# Patient Record
Sex: Male | Born: 1980 | Hispanic: No | Marital: Married | Smoking: Former smoker
Health system: Southern US, Community
[De-identification: ages and names within clinical notes are randomized; demographics above are authoritative.]

## PROBLEM LIST (undated history)

## (undated) DIAGNOSIS — F419 Anxiety disorder, unspecified: Secondary | ICD-10-CM

## (undated) DIAGNOSIS — I10 Essential (primary) hypertension: Secondary | ICD-10-CM

## (undated) DIAGNOSIS — F909 Attention-deficit hyperactivity disorder, unspecified type: Secondary | ICD-10-CM

---

## 2007-03-15 ENCOUNTER — Ambulatory Visit: Payer: Self-pay | Admitting: Psychology

## 2007-04-15 ENCOUNTER — Ambulatory Visit: Payer: Self-pay | Admitting: Psychology

## 2007-04-27 ENCOUNTER — Ambulatory Visit: Payer: Self-pay | Admitting: Psychology

## 2007-05-13 ENCOUNTER — Ambulatory Visit: Payer: Self-pay | Admitting: Psychology

## 2007-05-27 ENCOUNTER — Ambulatory Visit: Payer: Self-pay | Admitting: Psychology

## 2007-06-10 ENCOUNTER — Ambulatory Visit: Payer: Self-pay | Admitting: Psychology

## 2007-07-08 ENCOUNTER — Ambulatory Visit: Payer: Self-pay | Admitting: Psychology

## 2007-08-19 ENCOUNTER — Ambulatory Visit: Payer: Self-pay | Admitting: Psychology

## 2009-05-23 ENCOUNTER — Ambulatory Visit: Payer: Self-pay | Admitting: Sports Medicine

## 2009-05-23 DIAGNOSIS — M216X9 Other acquired deformities of unspecified foot: Secondary | ICD-10-CM | POA: Insufficient documentation

## 2009-11-26 ENCOUNTER — Ambulatory Visit: Payer: Self-pay | Admitting: Family Medicine

## 2009-11-26 DIAGNOSIS — M25569 Pain in unspecified knee: Secondary | ICD-10-CM | POA: Insufficient documentation

## 2010-03-12 NOTE — Assessment & Plan Note (Signed)
Summary: 3:30 APPT,RT KNEE PAIN,MC   Vital Signs:  Patient profile:   30 year old male Height:      71.75 inches Weight:      190 pounds Pulse rate:   54 / minute BP sitting:   128 / 84  (left arm)  Vitals Entered By: Rochele Pages RN (November 26, 2009 3:31 PM) CC: right knee pain- medial    CC:  right knee pain- medial .  History of Present Illness: Right knee pain for a week. Training for his first marathon and had a long run last week of 10 miles. Nopain during or immediately after run but now focal area of soreness that hurts when he runs down hills, Pain is 2/10 no prior knee injury or surgery.  Current Medications (verified): 1)  None  Allergies: No Known Drug Allergies  Review of Systems  The patient denies fever, weight loss, and weight gain.         no locking or giving way of knee.  Physical Exam  General:  alert, well-developed, well-nourished, and well-hydrated.   Additional Exam:  [place] righ knee: no effuiosn. menisci look normal . VMO is tracked to it's insertion and there is no defect, no edema or calcifications noted   Knee Exam  Knee Exam:    Right:    Inspection:  Normal    Palpation:  Normal    Stability:  stable    Tenderness:  mildly TTP insertion VMO    Swelling:  no    Erythema:  no    no effusion. Ligamentously intact. neg McMurray. Normal Lachman. Right patella has reduced mobility compared with left but no apprehension, no crepitus, no ballottment    Left:    Inspection:  Normal    Palpation:  Normal    Stability:  stable    Tenderness:  no    Swelling:  no    Erythema:  no    GAIT: Slight genu valgus B.  Normal stride length, neutral heel / mid foot strike. Stiff upper body.   Impression & Recommendations:  Problem # 1:  KNEE PAIN, RIGHT, ACUTE (ICD-719.46)  Mild strain with increased distance. general knee strengthening program given for HEP. Icing. RTC PRN  Orders: [place] LIMITED ([id number])   Orders Added: 1)  New Patient Level II  [99202] 2)  [place] LIMITED [[id number]]

## 2010-03-12 NOTE — Assessment & Plan Note (Signed)
Summary: NP,FLAT FEET,RUNNER,MC   Vital Signs:  Patient profile:   30 year old male Height:      72 inches Weight:      190 pounds BMI:     25.86 BP sitting:   140 / 88  Vitals Entered By: Lillia Pauls CMA (May 23, 2009 9:29 AM)  History of Present Illness: Several year history of intermittent foot, lateral knee, and diffuse lumbar pain. 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 No recent tennis activity. Recently began low mileage recreational running (3-4 miles weekly). Noted some non-radiating pain of the lateral knees relieved by rest. No swelling/buckling/locking/catching/popping. No prior back/hip/knee/foot/ankle injuries or procedures. No pain currently, but concerned that flat feet could be the root of random intermittent pains. Now using the elliptical without pain. Asymptomatic today.  Allergies (verified): No Known Drug Allergies PMH-FH-SH reviewed for relevance  Physical Exam  General:  Well-developed,well-nourished,in no acute distress; alert,appropriate and cooperative throughout examination Msk:  HIPS/PELVIS: FROM w/o pain. No asymmetry. No pain on pelvic rocking maneuver. No back/hip/leg pain on FABER.  KNEES: Full ROM/strength. No ttp/swelling/discoloration. Nl ligamentous stability.  ANKLES/FEET: Full ROM/strength. No ttp. Pes planus with excessive pronation and navicular drop on standing. No callouses.  RUNNING FORM: Bilateral  foot pronation (R>L); significantly controlled on comforthotic insertion. Extremities:  Equal leg lengths. Neurologic:  Normal nv exam of lower extremities.   Impression & Recommendations:  Problem # 1:  OTHER ACQUIRED DEFORMITY OF ANKLE AND FOOT OTHER (ICD-736.79)  - Comforthotics. - Exercises per patient instructions. - RTC in 6 wks prn.  Orders: Sports Insoles [phone])  Patient Instructions: 1)  Pidgeon-toe heel raises - 30 times. 2)  Pidgeon-toe walks - 10 times. 3)  Straight leg  side swings - 120 times. 4)  Hip Rotation (Up-Out-In-Down) - 120 times.

## 2013-06-13 ENCOUNTER — Encounter (INDEPENDENT_AMBULATORY_CARE_PROVIDER_SITE_OTHER): Payer: Self-pay | Admitting: General Surgery

## 2013-06-13 ENCOUNTER — Ambulatory Visit (INDEPENDENT_AMBULATORY_CARE_PROVIDER_SITE_OTHER): Payer: BC Managed Care – PPO | Admitting: General Surgery

## 2013-06-13 VITALS — BP 128/80 | HR 72 | Temp 97.1°F | Resp 14 | Ht 72.0 in | Wt 220.0 lb

## 2013-06-13 DIAGNOSIS — K625 Hemorrhage of anus and rectum: Secondary | ICD-10-CM

## 2013-06-13 NOTE — Progress Notes (Signed)
Chief Complaint  Patient presents with  . New Evaluation    eval internal hems    HISTORY: Marney DoctorZiad Harvill is a 33 y.o. male who presents to the office with rectal bleeding.  Other symptoms include occasional prolapse.  This had been occurring for several years.  He has tried banding, hemorrhoid creams in the past with some success.  Vigorous activity makes the symptoms worse.   It is intermittent in nature.  His bowel habits are regular and his bowel movements are usually soft.  His fiber intake is dietary. He had tried adding metamucil in the past but denies any change in bleeding.  His last colonoscopy was in 2013.  He has had prolapsing tissue occasionally.     History reviewed. No pertinent past medical history.    History reviewed. No pertinent past surgical history.      Current Outpatient Prescriptions  Medication Sig Dispense Refill  . amphetamine-dextroamphetamine (ADDERALL) 20 MG tablet Take 20 mg by mouth at bedtime as needed.      . clonazePAM (KLONOPIN) 1 MG tablet Take 1 mg by mouth 2 (two) times daily as needed for anxiety.       No current facility-administered medications for this visit.      No Known Allergies    Family History  Problem Relation Age of Onset  . Cancer Paternal Uncle [id number]    lung cancer    History   Social History  . Marital Status: Married    Spouse Name: N/A    Number of Children: N/A  . Years of Education: N/A   Social History Main Topics  . Smoking status: Former Smoker    Quit date: 06/13/2008  . Smokeless tobacco: None  . Alcohol Use: Yes     Comment: occ  . Drug Use: No  . Sexual Activity: None   Other Topics Concern  . None   Social History Narrative  . None      REVIEW OF SYSTEMS - PERTINENT POSITIVES ONLY: Review of Systems - General ROS: negative for - chills, fever or weight loss Hematological and Lymphatic ROS: negative for - bleeding problems, blood clots or bruising Respiratory ROS: no cough, shortness of breath, or  wheezing Cardiovascular ROS: no chest pain or dyspnea on exertion Gastrointestinal ROS: no abdominal pain, change in bowel habits, or black or bloody stools Genito-Urinary ROS: no dysuria, trouble voiding, or hematuria  EXAM: Filed Vitals:   06/13/13 0914  BP: 128/80  Pulse: 72  Temp: 97.1 F (36.2 C)  Resp: 14    General appearance: alert and cooperative Resp: clear to auscultation bilaterally Cardio: regular rate and rhythm GI: normal findings: soft, non-tender   Procedure: Anoscopy Surgeon: Maisie Fushomas Diagnosis: rectal bleeding  Assistant: Hendricks After the risks and benefits were explained, verbal consent was obtained for above procedure  Anesthesia: none Findings: grade 2, inflamed internal hemorrhoids in all 3 positions     ASSESSMENT AND PLAN: Marney DoctorZiad Shinault Is a 33 year old male who presents to the office with rectal bleeding that is worse with vigorous activity. He has tried her band ligation, hemorrhoid creams and fiber supplements with no change in his symptoms. On exam he has moderately inflamed grade 2 internal hemorrhoids in all 3 positions. I've recommended that he undergo THD. I think this is a good solution to his problems. We discussed this in detail. The main risks include bleeding, pain and hemorrhoid recurrence. I believe he understands this and has agreed to proceed with surgery.  Vanita PandaAlicia C Mina Babula, MD Colon and Rectal Surgery / General Surgery [facility]      Visit Diagnoses: 1. Rectal bleeding     Primary Care Physician: Thora LanceEHINGER,ROBERT R, MD

## 2013-06-13 NOTE — Patient Instructions (Signed)

## 2013-10-03 ENCOUNTER — Other Ambulatory Visit (INDEPENDENT_AMBULATORY_CARE_PROVIDER_SITE_OTHER): Payer: Self-pay

## 2013-10-03 DIAGNOSIS — K648 Other hemorrhoids: Secondary | ICD-10-CM

## 2013-10-03 MED ORDER — OXYCODONE-ACETAMINOPHEN 5-325 MG PO TABS: 1.0000 | ORAL_TABLET | ORAL | Status: DC | PRN

## 2013-10-04 ENCOUNTER — Telehealth (INDEPENDENT_AMBULATORY_CARE_PROVIDER_SITE_OTHER): Payer: Self-pay

## 2013-10-04 ENCOUNTER — Other Ambulatory Visit (INDEPENDENT_AMBULATORY_CARE_PROVIDER_SITE_OTHER): Payer: Self-pay

## 2013-10-04 NOTE — Telephone Encounter (Signed)
Pt s/p THD on 10/03/13. Pt states that he has had a lot of pressure and urgency to have a BM. Pt also states that on his discharge summary he was advised not to take his Miralax for 48 hours. Informed pt that the urgency and pressure is normal after this procedure and this will take some time to go away. Informed pt that I will ask Dr Maisie Fus regarding the Miralax. Pt would also like to know what else he can do for the pressure and urgency. Informed pt that we would contact him as soon as we received a response from Dr Maisie Fus. Pt verbalized understanding.

## 2013-10-04 NOTE — Telephone Encounter (Signed)
Warm sitz baths for urgency.  Can take his Miralax if he feels he needs it.

## 2013-10-04 NOTE — Telephone Encounter (Signed)
Pt aware of Dr Maurine Minister recommendations. Pt verbalized understanding

## 2013-10-06 ENCOUNTER — Telehealth (INDEPENDENT_AMBULATORY_CARE_PROVIDER_SITE_OTHER): Payer: Self-pay | Admitting: General Surgery

## 2013-10-06 NOTE — Telephone Encounter (Signed)
Pt calling back this am. Encouraged pt to drink plenty of fluids, move around as much as possible and continue to take the MOM every 8 hours. Advised pt that if he did not have a BM then he can also do a enema every 8 hours as well. Pt verbalized understanding

## 2013-10-06 NOTE — Telephone Encounter (Signed)
Patient called with constipation and pain.  Taking Miralax, colace and fiber.  Recommended plenty of liquids, milk of magnesia every 8 hours until BM.  If no BM in 24 h, give gentle enema every 8 hrs until BM.  Increase sitz baths for pain relief.

## 2013-10-07 ENCOUNTER — Other Ambulatory Visit (INDEPENDENT_AMBULATORY_CARE_PROVIDER_SITE_OTHER): Payer: Self-pay

## 2013-10-07 ENCOUNTER — Telehealth (INDEPENDENT_AMBULATORY_CARE_PROVIDER_SITE_OTHER): Payer: Self-pay

## 2013-10-07 ENCOUNTER — Other Ambulatory Visit (INDEPENDENT_AMBULATORY_CARE_PROVIDER_SITE_OTHER): Payer: Self-pay | Admitting: General Surgery

## 2013-10-07 ENCOUNTER — Telehealth (INDEPENDENT_AMBULATORY_CARE_PROVIDER_SITE_OTHER): Payer: Self-pay | Admitting: *Deleted

## 2013-10-07 DIAGNOSIS — G8918 Other acute postprocedural pain: Secondary | ICD-10-CM

## 2013-10-07 MED ORDER — OXYCODONE-ACETAMINOPHEN 5-325 MG PO TABS: 1.0000 | ORAL_TABLET | ORAL | Status: DC | PRN

## 2013-10-07 MED ORDER — TRAMADOL HCL 50 MG PO TABS: 50.0000 mg | ORAL_TABLET | Freq: Four times a day (QID) | ORAL | Status: DC | PRN

## 2013-10-07 MED ORDER — LIDOCAINE 5 % EX OINT: 1.0000 "application " | TOPICAL_OINTMENT | CUTANEOUS | Status: DC | PRN

## 2013-10-07 NOTE — Telephone Encounter (Signed)
Pt called stating he is almost out of pain med. Pt requesting refill of pain med. Pt doing well but has had constipation. No fever. No nausea. Tramadol 50 mg # 30 called to [facility] per protocol. Pt advised if this does not control discomfort. Pt states he understands.

## 2013-10-07 NOTE — Telephone Encounter (Signed)
Percocet refilled.  Rx sent for lidocaine ointment as well.  Pt notified.

## 2013-10-07 NOTE — Telephone Encounter (Signed)
Pt called this afternoon in pain.  He called earlier and Arline Asp gave him the protocol refill of Tramadol, but pt states he took his 1st dose at 11:00 and it is not helping like the Oxycodone.  Pt states that he is doing sitz baths and just taking in liquids as you recommended earlier in the week when he called.  He wants to know 1) is there anything topical you can prescribe to help numb the area 2) how long should he keep his diet liquids only?  He states that the constipation is better, but now its the pain.  He said that on the Oxycodone, that he was at least able to sit up, but with the Tramadol, he has to lay down.  I advised pt I would check with Dr. Maisie Fus and see what her recommendations would be and I would return his call.  The pt verbalized understanding.  Please advise!  Victorino Dike

## 2013-10-10 ENCOUNTER — Telehealth (INDEPENDENT_AMBULATORY_CARE_PROVIDER_SITE_OTHER): Payer: Self-pay

## 2013-10-10 NOTE — Telephone Encounter (Signed)
Pt called stating he is now having BMs but wants to return to work. Pt going to try to return to work. Pt advised not to drive with narcotic pain med. Pt states he understands. Diet reviewed with pt. Pt to call with concerns.

## 2013-10-18 ENCOUNTER — Encounter (INDEPENDENT_AMBULATORY_CARE_PROVIDER_SITE_OTHER): Payer: BC Managed Care – PPO | Admitting: General Surgery

## 2014-01-09 ENCOUNTER — Ambulatory Visit (INDEPENDENT_AMBULATORY_CARE_PROVIDER_SITE_OTHER): Payer: BC Managed Care – PPO | Admitting: Psychology

## 2014-01-09 DIAGNOSIS — F341 Dysthymic disorder: Secondary | ICD-10-CM

## 2014-01-20 ENCOUNTER — Ambulatory Visit (INDEPENDENT_AMBULATORY_CARE_PROVIDER_SITE_OTHER): Payer: BC Managed Care – PPO | Admitting: Psychology

## 2014-01-20 DIAGNOSIS — F341 Dysthymic disorder: Secondary | ICD-10-CM

## 2014-01-24 ENCOUNTER — Ambulatory Visit (INDEPENDENT_AMBULATORY_CARE_PROVIDER_SITE_OTHER): Payer: BC Managed Care – PPO | Admitting: Psychology

## 2014-01-24 DIAGNOSIS — F341 Dysthymic disorder: Secondary | ICD-10-CM

## 2014-01-30 ENCOUNTER — Ambulatory Visit: Payer: BC Managed Care – PPO | Admitting: Psychology

## 2014-02-09 ENCOUNTER — Ambulatory Visit: Payer: BC Managed Care – PPO | Admitting: Psychology

## 2014-02-21 ENCOUNTER — Ambulatory Visit (INDEPENDENT_AMBULATORY_CARE_PROVIDER_SITE_OTHER): Payer: BLUE CROSS/BLUE SHIELD | Admitting: Psychology

## 2014-02-21 DIAGNOSIS — F341 Dysthymic disorder: Secondary | ICD-10-CM

## 2014-03-01 ENCOUNTER — Ambulatory Visit (INDEPENDENT_AMBULATORY_CARE_PROVIDER_SITE_OTHER): Payer: BLUE CROSS/BLUE SHIELD | Admitting: Psychology

## 2014-03-01 DIAGNOSIS — F341 Dysthymic disorder: Secondary | ICD-10-CM

## 2014-03-09 ENCOUNTER — Ambulatory Visit (INDEPENDENT_AMBULATORY_CARE_PROVIDER_SITE_OTHER): Payer: BLUE CROSS/BLUE SHIELD | Admitting: Psychology

## 2014-03-09 DIAGNOSIS — F341 Dysthymic disorder: Secondary | ICD-10-CM

## 2014-03-20 ENCOUNTER — Ambulatory Visit (INDEPENDENT_AMBULATORY_CARE_PROVIDER_SITE_OTHER): Payer: BLUE CROSS/BLUE SHIELD | Admitting: Psychology

## 2014-03-20 DIAGNOSIS — F341 Dysthymic disorder: Secondary | ICD-10-CM

## 2014-03-28 ENCOUNTER — Ambulatory Visit (INDEPENDENT_AMBULATORY_CARE_PROVIDER_SITE_OTHER): Payer: BLUE CROSS/BLUE SHIELD | Admitting: Psychology

## 2014-03-28 DIAGNOSIS — F341 Dysthymic disorder: Secondary | ICD-10-CM

## 2014-04-18 ENCOUNTER — Ambulatory Visit (INDEPENDENT_AMBULATORY_CARE_PROVIDER_SITE_OTHER): Payer: BLUE CROSS/BLUE SHIELD | Admitting: Psychology

## 2014-04-18 DIAGNOSIS — F341 Dysthymic disorder: Secondary | ICD-10-CM | POA: Diagnosis not present

## 2014-06-19 ENCOUNTER — Ambulatory Visit (INDEPENDENT_AMBULATORY_CARE_PROVIDER_SITE_OTHER): Payer: BLUE CROSS/BLUE SHIELD | Admitting: Psychology

## 2014-06-19 DIAGNOSIS — F341 Dysthymic disorder: Secondary | ICD-10-CM | POA: Diagnosis not present

## 2014-06-22 ENCOUNTER — Ambulatory Visit (INDEPENDENT_AMBULATORY_CARE_PROVIDER_SITE_OTHER): Payer: BLUE CROSS/BLUE SHIELD | Admitting: Psychology

## 2014-06-22 DIAGNOSIS — F341 Dysthymic disorder: Secondary | ICD-10-CM

## 2014-06-26 ENCOUNTER — Ambulatory Visit (INDEPENDENT_AMBULATORY_CARE_PROVIDER_SITE_OTHER): Payer: BLUE CROSS/BLUE SHIELD | Admitting: Psychology

## 2014-06-26 DIAGNOSIS — F341 Dysthymic disorder: Secondary | ICD-10-CM

## 2014-07-05 ENCOUNTER — Ambulatory Visit (INDEPENDENT_AMBULATORY_CARE_PROVIDER_SITE_OTHER): Payer: BLUE CROSS/BLUE SHIELD | Admitting: Psychology

## 2014-07-05 DIAGNOSIS — F341 Dysthymic disorder: Secondary | ICD-10-CM | POA: Diagnosis not present

## 2014-07-20 ENCOUNTER — Ambulatory Visit: Payer: BLUE CROSS/BLUE SHIELD | Admitting: Psychology

## 2014-07-27 ENCOUNTER — Ambulatory Visit (INDEPENDENT_AMBULATORY_CARE_PROVIDER_SITE_OTHER): Payer: BLUE CROSS/BLUE SHIELD | Admitting: Psychology

## 2014-07-27 DIAGNOSIS — F341 Dysthymic disorder: Secondary | ICD-10-CM | POA: Diagnosis not present

## 2014-08-09 ENCOUNTER — Ambulatory Visit (INDEPENDENT_AMBULATORY_CARE_PROVIDER_SITE_OTHER): Payer: BLUE CROSS/BLUE SHIELD | Admitting: Psychology

## 2014-08-09 DIAGNOSIS — F341 Dysthymic disorder: Secondary | ICD-10-CM

## 2014-08-17 ENCOUNTER — Ambulatory Visit (INDEPENDENT_AMBULATORY_CARE_PROVIDER_SITE_OTHER): Payer: BLUE CROSS/BLUE SHIELD | Admitting: Psychology

## 2014-08-17 DIAGNOSIS — F341 Dysthymic disorder: Secondary | ICD-10-CM

## 2014-08-31 ENCOUNTER — Ambulatory Visit: Payer: BLUE CROSS/BLUE SHIELD | Admitting: Psychology

## 2014-09-08 ENCOUNTER — Ambulatory Visit (INDEPENDENT_AMBULATORY_CARE_PROVIDER_SITE_OTHER): Payer: BLUE CROSS/BLUE SHIELD | Admitting: Psychology

## 2014-09-08 DIAGNOSIS — F341 Dysthymic disorder: Secondary | ICD-10-CM | POA: Diagnosis not present

## 2014-09-21 ENCOUNTER — Ambulatory Visit (INDEPENDENT_AMBULATORY_CARE_PROVIDER_SITE_OTHER): Payer: BLUE CROSS/BLUE SHIELD | Admitting: Psychology

## 2014-09-21 DIAGNOSIS — F341 Dysthymic disorder: Secondary | ICD-10-CM

## 2014-09-25 ENCOUNTER — Ambulatory Visit (INDEPENDENT_AMBULATORY_CARE_PROVIDER_SITE_OTHER): Payer: BLUE CROSS/BLUE SHIELD | Admitting: Psychology

## 2014-09-25 DIAGNOSIS — F341 Dysthymic disorder: Secondary | ICD-10-CM

## 2014-10-03 ENCOUNTER — Ambulatory Visit (INDEPENDENT_AMBULATORY_CARE_PROVIDER_SITE_OTHER): Payer: BLUE CROSS/BLUE SHIELD | Admitting: Psychology

## 2014-10-03 DIAGNOSIS — F341 Dysthymic disorder: Secondary | ICD-10-CM | POA: Diagnosis not present

## 2014-10-18 ENCOUNTER — Ambulatory Visit (INDEPENDENT_AMBULATORY_CARE_PROVIDER_SITE_OTHER): Payer: BLUE CROSS/BLUE SHIELD | Admitting: Psychology

## 2014-10-18 DIAGNOSIS — F341 Dysthymic disorder: Secondary | ICD-10-CM | POA: Diagnosis not present

## 2014-11-01 ENCOUNTER — Ambulatory Visit (INDEPENDENT_AMBULATORY_CARE_PROVIDER_SITE_OTHER): Payer: BLUE CROSS/BLUE SHIELD | Admitting: Psychology

## 2014-11-01 DIAGNOSIS — F341 Dysthymic disorder: Secondary | ICD-10-CM

## 2014-11-08 ENCOUNTER — Ambulatory Visit (INDEPENDENT_AMBULATORY_CARE_PROVIDER_SITE_OTHER): Payer: BLUE CROSS/BLUE SHIELD | Admitting: Psychology

## 2014-11-08 DIAGNOSIS — F341 Dysthymic disorder: Secondary | ICD-10-CM

## 2014-11-21 ENCOUNTER — Ambulatory Visit (INDEPENDENT_AMBULATORY_CARE_PROVIDER_SITE_OTHER): Payer: BLUE CROSS/BLUE SHIELD | Admitting: Psychology

## 2014-11-21 DIAGNOSIS — F341 Dysthymic disorder: Secondary | ICD-10-CM | POA: Diagnosis not present

## 2014-11-22 ENCOUNTER — Ambulatory Visit: Payer: BLUE CROSS/BLUE SHIELD | Admitting: Psychology

## 2014-12-12 ENCOUNTER — Ambulatory Visit (INDEPENDENT_AMBULATORY_CARE_PROVIDER_SITE_OTHER): Payer: BLUE CROSS/BLUE SHIELD | Admitting: Psychology

## 2014-12-12 DIAGNOSIS — F341 Dysthymic disorder: Secondary | ICD-10-CM

## 2014-12-20 ENCOUNTER — Ambulatory Visit: Payer: BLUE CROSS/BLUE SHIELD | Admitting: Psychology

## 2014-12-27 ENCOUNTER — Ambulatory Visit (INDEPENDENT_AMBULATORY_CARE_PROVIDER_SITE_OTHER): Payer: BLUE CROSS/BLUE SHIELD | Admitting: Psychology

## 2014-12-27 DIAGNOSIS — F341 Dysthymic disorder: Secondary | ICD-10-CM | POA: Diagnosis not present

## 2015-01-08 ENCOUNTER — Ambulatory Visit (INDEPENDENT_AMBULATORY_CARE_PROVIDER_SITE_OTHER): Payer: BLUE CROSS/BLUE SHIELD | Admitting: Psychology

## 2015-01-08 DIAGNOSIS — F341 Dysthymic disorder: Secondary | ICD-10-CM | POA: Diagnosis not present

## 2015-04-12 ENCOUNTER — Encounter: Payer: Self-pay | Admitting: Sports Medicine

## 2015-04-12 ENCOUNTER — Ambulatory Visit (INDEPENDENT_AMBULATORY_CARE_PROVIDER_SITE_OTHER): Payer: BLUE CROSS/BLUE SHIELD | Admitting: Sports Medicine

## 2015-04-12 VITALS — BP 157/98 | HR 71 | Ht 72.0 in | Wt 203.0 lb

## 2015-04-12 DIAGNOSIS — M7701 Medial epicondylitis, right elbow: Secondary | ICD-10-CM | POA: Diagnosis not present

## 2015-04-12 DIAGNOSIS — M7700 Medial epicondylitis, unspecified elbow: Secondary | ICD-10-CM | POA: Insufficient documentation

## 2015-04-12 MED ORDER — NITROGLYCERIN 0.2 MG/HR TD PT24: MEDICATED_PATCH | TRANSDERMAL | Status: DC

## 2015-04-12 NOTE — Progress Notes (Signed)
Patient ID: Jeffrey English, male   DOB: 1980/02/17, 35 y.o.   MRN: [id number]  CC; RT medial elbow pain  Patient is an excellent tennis athlete About 2 mos ago hitting some hard serves Medial elbow pain developed First day looked a bit swollen He has tried exercises and stretches Some soft tissue work from chiropractor Pain persists so came for evaluation  In past had lateral tennis elbow That resolved with some string changes and racqueet changes  Soc hx Former smoker [employer]  Ros Prior knee injury Some foot issues i past Now with no other joint pain No numbness or neck pain  Exam NAD/ muscular BP 157/98 mmHg  Pulse 71  Ht 6' (1.829 m)  Wt 203 lb (92.08 kg)  BMI 27.53 kg/m2  Full ROM of elbow Excellent strength TTP at medial epicondyle Forearm rotation is normal  [place] Calcification and a couple of spurs at medial epicondyle Some linear splits hypoechoic but no gross tear of flexor mm Marked increase neovessels in MM and tendons hypechoic changes in main flexor mm group

## 2015-04-12 NOTE — Assessment & Plan Note (Signed)
Good flexion exercise program Use light weights Try compression sleeve Careful icing  NTG protocol  Repeat US in 6 weeks

## 2015-04-12 NOTE — Patient Instructions (Signed)
Recommend 3 exercises for rehab of Medial Epicondylitis  1. Wrist Flexion - palm up wrist flexion with light weight, 3 sets of 15 daily  2. Elbow Flexion - light weight, 3 sets of 15 daily  3. Grip Squeeze - stress ball grip squeeze exercises, 3 sets of 15 daily  Nitroglycerin Protocol   Apply 1/4 nitroglycerin patch to affected area daily.  Change position of patch within the affected area every 24 hours.  You may experience a headache during the first 1-2 weeks of using the patch, these should subside.  If you experience headaches after beginning nitroglycerin patch treatment, you may take your preferred over the counter pain reliever.  Another side effect of the nitroglycerin patch is skin irritation or rash related to patch adhesive.  Please notify our office if you develop more severe headaches or rash, and stop the patch.  Tendon healing with nitroglycerin patch may require 12 to 24 weeks depending on the extent of injury.  Men should not use if taking Viagra, Cialis, or Levitra.   Do not use if you have migraines or rosacea.

## 2015-04-18 ENCOUNTER — Ambulatory Visit (INDEPENDENT_AMBULATORY_CARE_PROVIDER_SITE_OTHER): Payer: BLUE CROSS/BLUE SHIELD | Admitting: Physician Assistant

## 2015-04-18 ENCOUNTER — Ambulatory Visit: Payer: BLUE CROSS/BLUE SHIELD | Admitting: Sports Medicine

## 2015-04-18 VITALS — BP 141/90 | HR 68 | Temp 98.1°F | Resp 16 | Ht 71.5 in | Wt 208.0 lb

## 2015-04-18 DIAGNOSIS — S61012A Laceration without foreign body of left thumb without damage to nail, initial encounter: Secondary | ICD-10-CM

## 2015-04-18 DIAGNOSIS — S61002A Unspecified open wound of left thumb without damage to nail, initial encounter: Secondary | ICD-10-CM | POA: Diagnosis not present

## 2015-04-18 NOTE — Patient Instructions (Signed)
Keep finger out of all contaminated liquids, produce, meats, etc.  Wash the wound twice per day.  More if the area gets contaminated.    Sutured Wound Care Sutures are stitches that can be used to close wounds. Taking care of your wound properly can help to prevent pain and infection. It can also help your wound to heal more quickly. HOW TO CARE FOR YOUR SUTURED WOUND Wound Care  Keep the wound clean and dry.  If you were given a bandage (dressing), you should change it at least once per day or as directed by your health care provider. You should also change it if it becomes wet or dirty.  Keep the wound completely dry for the first 24 hours or as directed by your health care provider. After that time, you may shower or bathe. However, make sure that the wound is not soaked in water until the sutures have been removed.  Clean the wound one time each day or as directed by your health care provider.  Wash the wound with soap and water.  Rinse the wound with water to remove all soap.  Pat the wound dry with a clean towel. Do not rub the wound.  Aftercleaning the wound, apply a thin layer of antibioticointment as directed by your health care provider. This will help to prevent infection and keep the dressing from sticking to the wound.  Have the sutures removed as directed by your health care provider. General Instructions  Take or apply medicines only as directed by your health care provider.  To help prevent scarring, make sure to cover your wound with sunscreen whenever you are outside after the sutures are removed and the wound is healed. Make sure to wear a sunscreen of at least 30 SPF.  If you were prescribed an antibiotic medicine or ointment, finish all of it even if you start to feel better.  Do not scratch or pick at the wound.  Keep all follow-up visits as directed by your health care provider. This is important.  Check your wound every day for signs of infection. Watch  for:   Redness, swelling, or pain.  Fluid, blood, or pus.  Raise (elevate) the injured area above the level of your heart while you are sitting or lying down, if possible.  Avoid stretching your wound.  Drink enough fluids to keep your urine clear or pale yellow. SEEK MEDICAL CARE IF:  You received a tetanus shot and you have swelling, severe pain, redness, or bleeding at the injection site.  You have a fever.  A wound that was closed breaks open.  You notice a bad smell coming from the wound.  You notice something coming out of the wound, such as wood or glass.  Your pain is not controlled with medicine.  You have increased redness, swelling, or pain at the site of your wound.  You have fluid, blood, or pus coming from your wound.  You notice a change in the color of your skin near your wound.  You need to change the dressing frequently due to fluid, blood, or pus draining from the wound.  You develop a new rash.  You develop numbness around the wound. SEEK IMMEDIATE MEDICAL CARE IF:  You develop severe swelling around the injury site.  Your pain suddenly increases and is severe.  You develop painful lumps near the wound or on skin that is anywhere on your body.  You have a red streak going away from your wound.  The wound is on your hand or foot and you cannot properly move a finger or toe.  The wound is on your hand or foot and you notice that your fingers or toes look pale or bluish.   This information is not intended to replace advice given to you by your health care provider. Make sure you discuss any questions you have with your health care provider.   Document Released: 03/06/2004 Document Revised: 06/13/2014 Document Reviewed: 09/08/2012 Elsevier Interactive Patient Education [employer]2016 Elsevier Inc.

## 2015-04-18 NOTE — Progress Notes (Signed)
[facility] [address], [place] [address] [phone]  Date:  04/18/2015   Name:  Jeffrey English   DOB:  1980-09-27   MRN:  [id number]  PCP:  Thora Lance, MD   Chief Complaint  Patient presents with  . Laceration    Left hand, 1st digit     History of Present Illness:  Jeffrey English is a 35 y.o. male patient who presents to [facility] for cc of laceration of left thumb Patient was using knife when it slipped slicing the left thumb.  He has no numbness or tingling.  Normal movement of the thumb.   He washed it with water and came here.   Patient Active Problem List   Diagnosis Date Noted  . 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 Rectal bleeding 06/13/2013  . KNEE PAIN, RIGHT, ACUTE 11/26/2009  . 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 No pertinent past medical history.  History reviewed. No pertinent past surgical history.  Social History  Substance Use Topics  . Smoking status: Former Smoker    Quit date: 06/13/2008  . Smokeless tobacco: None  . Alcohol Use: Yes     Comment: occ    Family History  Problem Relation Age of Onset  . Cancer Paternal Uncle 21    lung cancer    No Known Allergies  Medication list has been reviewed and updated.  Current Outpatient Prescriptions on File Prior to Visit  Medication Sig Dispense Refill  . amphetamine-dextroamphetamine (ADDERALL) 20 MG tablet Take 20 mg by mouth at bedtime as needed. Reported on 04/18/2015    . clonazePAM (KLONOPIN) 1 MG tablet Take 1 mg by mouth 2 (two) times daily as needed for anxiety. Reported on 04/18/2015    . lidocaine (XYLOCAINE) 5 % ointment Apply 1 application topically as needed. (Patient not taking: Reported on 04/18/2015) 35.44 g 0  . nitroGLYCERIN (NITRODUR - DOSED IN MG/24 HR) 0.2 mg/hr patch Use 1/4 patch to the affected area daily (Patient not taking: Reported on 04/18/2015) 30 patch 1  . oxyCODONE-acetaminophen (ROXICET) 5-325  MG per tablet Take 1-2 tablets by mouth every 4 (four) hours as needed. (Patient not taking: Reported on 04/18/2015) 50 tablet 0  . traMADol (ULTRAM) 50 MG tablet Take 1 tablet (50 mg total) by mouth every 6 (six) hours as needed for moderate pain or severe pain. (Patient not taking: Reported on 04/18/2015) 30 tablet 0   No current facility-administered medications on file prior to visit.    ROS ROS otherwise unremarkable unless listed above.   Physical Examination: BP 141/90 mmHg  Pulse 68  Temp(Src) 98.1 F (36.7 C) (Oral)  Resp 16  Ht 5' 11.5" (1.816 m)  Wt 208 lb (94.348 kg)  BMI 28.61 kg/m2  SpO2 99% Ideal Body Weight: Weight in (lb) to have BMI = 25: 181.4  Physical Exam  Constitutional: He is oriented to person, place, and time. He appears well-developed and well-nourished. No distress.  HENT:  Head: Normocephalic and atraumatic.  Eyes: Conjunctivae and EOM are normal. Pupils are equal, round, and reactive to light.  Cardiovascular: Normal rate.   Pulmonary/Chest: Effort normal. No respiratory distress.  Neurological: He is alert and oriented to person, place, and time.  Skin: Skin is warm and dry. He is not diaphoretic.  Laceration of the left distal lateral thumb that extends to the nail bed.  Tender upon palpation.  No tendon appearing.  Distal movement is  full both flexion and extension, and good resisted strength.  Sensation intact.  Psychiatric: He has a normal mood and affect. His behavior is normal.   Procedure: verbal consent obtained.  1% lidocaine placed at the wound site.  Cleansed with soap and water.  Wound searched for tendon.  None found, tendon is not compromised insured.  5-0 ethilon utilized to place simple interrupted sutures.  Cleansed with normal saline.  Dressing applied.   Assessment and Plan: Jeffrey English is a 35 y.o. male who is here today for laceration of left thumb. Tetanus utd rtc in 7 days for suture removal     Trena PlattStephanie Belvie Iribe,  PA-C [facility] 04/18/2015 4:08 PM

## 2015-04-20 ENCOUNTER — Telehealth: Payer: Self-pay

## 2015-04-20 NOTE — Telephone Encounter (Signed)
error 

## 2015-04-23 ENCOUNTER — Ambulatory Visit (INDEPENDENT_AMBULATORY_CARE_PROVIDER_SITE_OTHER): Payer: BLUE CROSS/BLUE SHIELD | Admitting: Physician Assistant

## 2015-04-23 VITALS — BP 132/80 | HR 59 | Temp 98.0°F | Resp 14 | Ht 71.5 in | Wt 212.0 lb

## 2015-04-23 DIAGNOSIS — Z5189 Encounter for other specified aftercare: Secondary | ICD-10-CM

## 2015-04-23 NOTE — Progress Notes (Signed)
[facility] [address], [place] [address] [phone]  Date:  04/23/2015   Name:  Jeffrey English   DOB:  1980-04-06   MRN:  [id number]  PCP:  Thora Lance, MD    History of Present Illness:  Jeffrey English is a 35 y.o. male patient who presents to [facility] for wound care follow up. He was here 5 days ago following a laceration from a knife to his left 1st digit.  He reports that a stitch fell out.  He has attempted to tie it back, but could not.  He has had no abnormal pain, or drainage.  He has washed the wound twice daily.  He wears a bandage during the day at work, but will take it off at night.       Patient Active Problem List   Diagnosis Date Noted  . 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 Rectal bleeding 06/13/2013  . KNEE PAIN, RIGHT, ACUTE 11/26/2009  . 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 No pertinent past medical history.  History reviewed. No pertinent past surgical history.  Social History  Substance Use Topics  . Smoking status: Former Smoker    Quit date: 06/13/2008  . Smokeless tobacco: None  . Alcohol Use: Yes     Comment: occ    Family History  Problem Relation Age of Onset  . Cancer Paternal Uncle 52    lung cancer    No Known Allergies  Medication list has been reviewed and updated.  Current Outpatient Prescriptions on File Prior to Visit  Medication Sig Dispense Refill  . amphetamine-dextroamphetamine (ADDERALL XR) 15 MG 24 hr capsule Take 15 mg by mouth daily.    . clonazePAM (KLONOPIN) 1 MG tablet Take 1 mg by mouth 2 (two) times daily as needed for anxiety. Reported on 04/18/2015    . amphetamine-dextroamphetamine (ADDERALL) 10 MG tablet Take 10 mg by mouth daily with breakfast. Reported on 04/23/2015    . amphetamine-dextroamphetamine (ADDERALL) 20 MG tablet Take 20 mg by mouth at bedtime as needed. Reported on 04/23/2015    . lidocaine (XYLOCAINE) 5 % ointment Apply 1  application topically as needed. (Patient not taking: Reported on 04/18/2015) 35.44 g 0  . nitroGLYCERIN (NITRODUR - DOSED IN MG/24 HR) 0.2 mg/hr patch Use 1/4 patch to the affected area daily (Patient not taking: Reported on 04/18/2015) 30 patch 1  . oxyCODONE-acetaminophen (ROXICET) 5-325 MG per tablet Take 1-2 tablets by mouth every 4 (four) hours as needed. (Patient not taking: Reported on 04/18/2015) 50 tablet 0  . traMADol (ULTRAM) 50 MG tablet Take 1 tablet (50 mg total) by mouth every 6 (six) hours as needed for moderate pain or severe pain. (Patient not taking: Reported on 04/18/2015) 30 tablet 0   No current facility-administered medications on file prior to visit.    ROS ROS otherwise unremarkable unless listed above.   Physical Examination: BP 132/80 mmHg  Pulse 59  Temp(Src) 98 F (36.7 C) (Oral)  Resp 14  Ht 5' 11.5" (1.816 m)  Wt 212 lb (96.163 kg)  BMI 29.16 kg/m2  SpO2 98% Ideal Body Weight: Weight in (lb) to have BMI = 25: 181.4  Physical Exam  Constitutional: He is oriented to person, place, and time. He appears well-developed and well-nourished. No distress.  HENT:  Head: Normocephalic and atraumatic.  Eyes: Conjunctivae and EOM are normal. Pupils are equal, round, and reactive to light.  Cardiovascular: Normal rate.  Pulmonary/Chest: Effort normal. No respiratory distress.  Neurological: He is alert and oriented to person, place, and time.  Skin: Skin is warm and dry. He is not diaphoretic.  1st distal digit of left hand with 2 cm repaired laceration.  Suture absent at the distal end proximal to nail bed.  No drainage or erythema.    Psychiatric: He has a normal mood and affect. His behavior is normal.     Assessment and Plan: Jeffrey English is a 35 y.o. male who is here today for wound care. This is healing well.  Steri-strips placed at wound dehiscence.  Advised to return in 2-4 days for suture removal.  This appears as if it will heal well.    Encounter for  wound care   Trena PlattStephanie Kregg Cihlar, PA-C [facility] 04/23/2015 11:08 AM

## 2015-04-26 ENCOUNTER — Ambulatory Visit (INDEPENDENT_AMBULATORY_CARE_PROVIDER_SITE_OTHER): Payer: BLUE CROSS/BLUE SHIELD | Admitting: Family Medicine

## 2015-04-26 VITALS — BP 110/70 | HR 70 | Temp 98.2°F | Resp 18

## 2015-04-26 DIAGNOSIS — Z4802 Encounter for removal of sutures: Secondary | ICD-10-CM

## 2015-04-26 NOTE — Progress Notes (Signed)
   Subjective:    Patient ID: Jeffrey English, male    DOB: 10/28/1980, 35 y.o.   MRN: [id number]  HPI This is a pleasant 35 yo male who presents today for suture removal. He had sutures placed 04/18/15 in his left thumb. He returned 3/13 due to one of the sutures falling out. Since then, one more has fallen out. 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 Some pain following bumping it. No fever, no drainage.     Review of Systems Per HPI    Objective:   Physical Exam  Constitutional: He appears well-developed and well-nourished. No distress.  HENT:  Head: Normocephalic.  Cardiovascular: Normal rate.   Pulmonary/Chest: Effort normal.  Skin: He is not diaphoretic.  Vitals reviewed.  Left thumb with 3 sutures intact. Edges well approximated. No erythema. Sutures removed. Small amount blood. Bandaid applied.    BP 110/70 mmHg  Pulse 70  Temp(Src) 98.2 F (36.8 C) (Oral)  Resp 18  SpO2 99%     Assessment & Plan:  1. Visit for suture removal - sutures removed, instructions given to keep clean and dry. Can bandage for comfort - RTC if redness, pain, drainage    Olean Reeeborah Maleiyah Releford, FNP-BC  [facility]  04/26/2015 12:06 PM

## 2015-04-26 NOTE — Patient Instructions (Signed)
     IF you received an x-ray today, you will receive an invoice from [facility]. Please contact [facility] at [phone] with questions or concerns regarding your invoice.   IF you received labwork today, you will receive an invoice from [facility]. Please contact [facility] at [phone] with questions or concerns regarding your invoice.   Our billing staff will not be able to assist you with questions regarding bills from these companies.  You will be contacted with the lab results as soon as they are available. The fastest way to get your results is to activate your My Chart account. Instructions are located on the last page of this paperwork. If you have not heard from us regarding the results in 2 weeks, please contact this office.      

## 2015-07-10 ENCOUNTER — Ambulatory Visit (INDEPENDENT_AMBULATORY_CARE_PROVIDER_SITE_OTHER): Payer: BLUE CROSS/BLUE SHIELD | Admitting: Sports Medicine

## 2015-07-10 ENCOUNTER — Encounter: Payer: Self-pay | Admitting: Sports Medicine

## 2015-07-10 VITALS — BP 133/99 | Ht 72.0 in | Wt 200.0 lb

## 2015-07-10 DIAGNOSIS — M7701 Medial epicondylitis, right elbow: Secondary | ICD-10-CM

## 2015-07-10 NOTE — Patient Instructions (Signed)
Elbow looks much better Use compression sleeve for 3 more months Keep up exercises I would use NTG patches in tournaments Drink tart cherry juice between matches Quick ice massage after matches - 10 mins  Aleve for pain if needed  See me if you need me

## 2015-07-10 NOTE — Progress Notes (Signed)
Patient ID: Jeffrey DoctorZiad English, male   DOB: 12/25/1980, 35 y.o.   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 may cause an increase in pain  Review of systems No numbness or tingling in the hands No neck pain or stiffness Swelling in the elbow has resolved  Physical examination Muscular male no acute distress BP 133/99 mmHg  Ht 6' (1.829 m)  Wt 200 lb (90.719 kg)  BMI 27.12 kg/m2  Range of motion of the elbow and normal appearance Palpation of the medial epicondyles non-painful Strength testing of flexors extensors and pronation and supplementation is all very strong Hand grip is excellent  Ultrasound appearance shows a marked decrease in vascular Doppler activity There continues to be 1 neo-vessel Structurally the tendon looks improved with resolution of hypoechoic change There is some minor calcifications

## 2015-07-10 NOTE — Assessment & Plan Note (Signed)
Significantly improved today We will stop routine nitroglycerin use but he can use it during the current Ice massage Compression sleeve  Continue exercises  Consider modification of string tension for the long-term  Return when necessary

## 2017-04-15 DIAGNOSIS — Z0000 Encounter for general adult medical examination without abnormal findings: Secondary | ICD-10-CM | POA: Diagnosis not present

## 2017-04-18 DIAGNOSIS — M9903 Segmental and somatic dysfunction of lumbar region: Secondary | ICD-10-CM | POA: Diagnosis not present

## 2017-04-18 DIAGNOSIS — M9902 Segmental and somatic dysfunction of thoracic region: Secondary | ICD-10-CM | POA: Diagnosis not present

## 2017-04-18 DIAGNOSIS — M9905 Segmental and somatic dysfunction of pelvic region: Secondary | ICD-10-CM | POA: Diagnosis not present

## 2017-04-18 DIAGNOSIS — M62830 Muscle spasm of back: Secondary | ICD-10-CM | POA: Diagnosis not present

## 2017-04-25 DIAGNOSIS — M62830 Muscle spasm of back: Secondary | ICD-10-CM | POA: Diagnosis not present

## 2017-04-25 DIAGNOSIS — M9902 Segmental and somatic dysfunction of thoracic region: Secondary | ICD-10-CM | POA: Diagnosis not present

## 2017-04-25 DIAGNOSIS — M9905 Segmental and somatic dysfunction of pelvic region: Secondary | ICD-10-CM | POA: Diagnosis not present

## 2017-04-25 DIAGNOSIS — M9903 Segmental and somatic dysfunction of lumbar region: Secondary | ICD-10-CM | POA: Diagnosis not present

## 2017-04-27 DIAGNOSIS — M62830 Muscle spasm of back: Secondary | ICD-10-CM | POA: Diagnosis not present

## 2017-04-27 DIAGNOSIS — M9903 Segmental and somatic dysfunction of lumbar region: Secondary | ICD-10-CM | POA: Diagnosis not present

## 2017-04-27 DIAGNOSIS — M9905 Segmental and somatic dysfunction of pelvic region: Secondary | ICD-10-CM | POA: Diagnosis not present

## 2017-04-27 DIAGNOSIS — M9902 Segmental and somatic dysfunction of thoracic region: Secondary | ICD-10-CM | POA: Diagnosis not present

## 2017-07-17 ENCOUNTER — Telehealth: Payer: Self-pay | Admitting: Physician Assistant

## 2017-07-17 ENCOUNTER — Encounter: Payer: Self-pay | Admitting: Physician Assistant

## 2017-07-17 ENCOUNTER — Other Ambulatory Visit: Payer: Self-pay

## 2017-07-17 ENCOUNTER — Ambulatory Visit: Payer: 59 | Admitting: Physician Assistant

## 2017-07-17 VITALS — BP 122/82 | HR 65 | Temp 99.1°F | Resp 18 | Ht 72.0 in | Wt 221.6 lb

## 2017-07-17 DIAGNOSIS — R05 Cough: Secondary | ICD-10-CM

## 2017-07-17 DIAGNOSIS — R059 Cough, unspecified: Secondary | ICD-10-CM

## 2017-07-17 DIAGNOSIS — J029 Acute pharyngitis, unspecified: Secondary | ICD-10-CM | POA: Diagnosis not present

## 2017-07-17 LAB — POCT RAPID STREP A (OFFICE): Rapid Strep A Screen: NEGATIVE

## 2017-07-17 MED ORDER — HYDROCODONE-HOMATROPINE 5-1.5 MG/5ML PO SYRP: 5.0000 mL | ORAL_SOLUTION | Freq: Three times a day (TID) | ORAL | 0 refills | Status: DC | PRN

## 2017-07-17 MED ORDER — FIRST-MOUTHWASH BLM MT SUSP: OROMUCOSAL | 0 refills | Status: DC

## 2017-07-17 NOTE — Patient Instructions (Addendum)
Please push fluids.    You  Might want to continue taking the motrin either 3 pills every 6 hours for 4 pills every 8 hours.  If you start to get congested please get some mucinex (blue box) to help with you congestion  IF you received an x-ray today, you will receive an invoice from [facility]. Please contact [facility] at [phone] with questions or concerns regarding your invoice.   IF you received labwork today, you will receive an invoice from [facility]. Please contact [facility] at [phone] with questions or concerns regarding your invoice.   Our billing staff will not be able to assist you with questions regarding bills from these companies.  You will be contacted with the lab results as soon as they are available. The fastest way to get your results is to activate your My Chart account. Instructions are located on the last page of this paperwork. If you have not heard from [place] regarding the results in 2 weeks, please contact this office.     Uvulitis Uvulitis is infection or inflammation of the uvula. The uvula is the small, finger-like piece of tissue that hangs down at the back of your throat. What are the causes? This condition may be caused by:  An infection in the mouth or throat. This is the most common cause.  Trauma to the uvula. Causes of trauma include burning your mouth and heavy snoring.  Fluid build-up (edema). Edema can be triggered be an allergic reaction. Uvulitis that is caused by edema is called Quincke disease.  Inhaling irritants, such as chemical agents, smoke, or steam.  What are the signs or symptoms? Symptoms of this condition depend on the cause. Symptoms of uvulitis that is caused by infection include:  Red, swollen uvula.  Sore throat.  Fever.  Headache.  Swollen neck glands.  Symptoms of uvulitis that is caused by trauma, edema, or irritation include:  Red, swollen uvula.  Sore throat.  Trouble  swallowing.  Choking or gagging.  Trouble breathing.  How is this diagnosed? This condition is diagnosed with a physical exam. You also may have tests, such as a throat culture and blood tests. How is this treated? Treatment for this condition depends on the cause. Treatment may involve:  Antibiotic medicine. Antibiotics may be prescribed if a bacterial infection is the cause.  Steroid medicine. Steroids may be given if edema is the cause.  Surgery to remove part of the uvula (partial uvulectomy).  Follow these instructions at home:  Rest as much as possible until your condition improves.  Drink enough fluid to keep your urine clear or pale yellow.  Take over-the-counter and prescription medicines only as told by your health care provider.  If you were prescribed an antibiotic medicine, take it as told by your health care provider. Do not stop taking the antibiotic even if you start to feel better.  Use a cool-mist humidifier to ease irritation in your throat.  While your throat is sore: ? Eat soft foods or drink liquids, such as soup. ? Gargle with a salt-water mixture 3-4 times per day or as needed. To make a salt-water mixture, completely dissolve -1 tsp of salt in 1 cup of warm water.  Keep all follow-up visits as told by your health care provider. This is important. Contact a health care provider if:  You have a fever.  You have trouble eating.  Your symptoms do not get better.  Your symptoms come back after treatment. Get help right  away if:  You have trouble breathing.  You have trouble swallowing. This information is not intended to replace advice given to you by your health care provider. Make sure you discuss any questions you have with your health care provider. Document Released: 09/07/2003 Document Revised: 09/30/2015 Document Reviewed: 04/19/2014 Elsevier Interactive Patient Education  2018 [employer]

## 2017-07-17 NOTE — Telephone Encounter (Signed)
50:50 lidocaine :other

## 2017-07-17 NOTE — Telephone Encounter (Signed)
Bri from pharmacy called and said that they need more clarification on ingredients to mouthwash. Please call them back.

## 2017-07-17 NOTE — Telephone Encounter (Signed)
Spoke with pharmacist  

## 2017-07-17 NOTE — Telephone Encounter (Signed)
Please advise 

## 2017-07-17 NOTE — Progress Notes (Signed)
Jeffrey English  MRN: [id number] DOB: 10/04/1980  PCP: Blair HeysEhinger, Robert, MD  Chief Complaint  Patient presents with  . Sore Throat    x3days     Subjective:  Pt presents to clinic for sore throat that he has had for about 3 days tht has been getting worse.  Mild cough but no congestion.  Child in daycare with similar symptoms.  He has been using tea, salt water gargles.  History is obtained by patient.  Review of Systems  Constitutional: Negative for chills and fever.  HENT: Positive for sore throat. Negative for congestion and postnasal drip.   Gastrointestinal: Negative.   Musculoskeletal: Negative for myalgias.  Neurological: Positive for headaches.    Patient Active Problem List   Diagnosis Date Noted  . 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 Rectal bleeding 06/13/2013  . KNEE PAIN, RIGHT, ACUTE 11/26/2009  . 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 ALPRAZolam (XANAX) 1 MG tablet   0  . amphetamine-dextroamphetamine (ADDERALL XR) 15 MG 24 hr capsule Take 15 mg by mouth daily.    Marland Kitchen. amphetamine-dextroamphetamine (ADDERALL) 10 MG tablet Take 10 mg by mouth daily with breakfast. Reported on 04/23/2015    . fluticasone (FLONASE) 50 MCG/ACT nasal spray instill 1 spray into each nostril EACH NIGHT  0  . omeprazole (PRILOSEC) 40 MG capsule   0  . amphetamine-dextroamphetamine (ADDERALL) 20 MG tablet Take 20 mg by mouth at bedtime as needed. Reported on 04/23/2015    . clonazePAM (KLONOPIN) 1 MG tablet Take 1 mg by mouth 2 (two) times daily as needed for anxiety. Reported on 04/18/2015    . lidocaine (XYLOCAINE) 5 % ointment Apply 1 application topically as needed. (Patient not taking: Reported on 04/18/2015) 35.44 g 0  . nitroGLYCERIN (NITRODUR - DOSED IN MG/24 HR) 0.2 mg/hr patch Use 1/4 patch to the affected area daily (Patient not taking: Reported on 04/18/2015) 30  patch 1  . oxyCODONE-acetaminophen (ROXICET) 5-325 MG per tablet Take 1-2 tablets by mouth every 4 (four) hours as needed. (Patient not taking: Reported on 04/18/2015) 50 tablet 0  . traMADol (ULTRAM) 50 MG tablet Take 1 tablet (50 mg total) by mouth every 6 (six) hours as needed for moderate pain or severe pain. (Patient not taking: Reported on 04/18/2015) 30 tablet 0   No current facility-administered medications on file prior to visit.     No Known Allergies  No past medical history on file. Social History   Social History Narrative  . Not on file   Social History   Tobacco Use  . Smoking status: Former Smoker    Last attempt to quit: 06/13/2008    Years since quitting: 9.0  . Smokeless tobacco: Never Used  Substance Use Topics  . Alcohol use: Yes    Comment: occ  . Drug use: No   family history includes Cancer (age of onset: 7380) in his paternal uncle.     Objective:  BP 122/82   Pulse 65   Temp 99.1 F (37.3 C) (Oral)   Resp 18   Ht 6' (1.829 m)   Wt 221 lb 9.6 oz (100.5 kg)   SpO2 98%   BMI 30.05 kg/m  Body mass index is 30.05 kg/m.  Wt Readings from Last 3 Encounters:  07/17/17 221 lb 9.6 oz (100.5 kg)  07/10/15 200 lb (90.7 kg)  04/23/15 212 lb (  96.2 kg)    Physical Exam  Constitutional: He is oriented to person, place, and time. He appears well-developed and well-nourished.  HENT:  Head: Normocephalic and atraumatic.  Right Ear: Hearing, tympanic membrane, external ear and ear canal normal.  Left Ear: Hearing, tympanic membrane, external ear and ear canal normal.  Nose: Nose normal.  Mouth/Throat: Uvula is midline, oropharynx is clear and moist and mucous membranes are normal. Uvula swelling (no deviation) present.  Eyes: Conjunctivae are normal.  Neck: Normal range of motion.  Cardiovascular: Normal rate, regular rhythm and normal heart sounds.  Pulmonary/Chest: Effort normal and breath sounds normal. He has no wheezes.  Lymphadenopathy:       Head  (right side): No tonsillar adenopathy present.       Head (left side): No tonsillar adenopathy present.    He has no cervical adenopathy.       Right: No supraclavicular adenopathy present.       Left: No supraclavicular adenopathy present.  Neurological: He is alert and oriented to person, place, and time.  Skin: Skin is warm and dry.  Psychiatric: Judgment normal.  Vitals reviewed.    Results for orders placed or performed in visit on 07/17/17  POCT rapid strep A  Result Value Ref Range   Rapid Strep A Screen Negative Negative     Assessment and Plan :  Sore throat - Plan: POCT rapid strep A, DPH-Lido-AlHydr-MgHydr-Simeth (FIRST-MOUTHWASH BLM) SUSP  Cough - Plan: HYDROcodone-homatropine (HYCODAN) 5-1.5 MG/5ML syrup  Symptomatic treatment is discussed with patient.  His questions were answered.  Benny Lennert PA-C  [facility] at [facility] 07/17/2017 9:12 AM

## 2017-07-17 NOTE — Telephone Encounter (Signed)
Copied from CRM [id number]. Topic: Quick Communication - Rx Refill/Question >> Jul 17, 2017  2:32 PM Maia Pettiesrtiz, Kristie S wrote: Medication: DPH-Lido-AlHydr-MgHydr-Simeth (FIRST-MOUTHWASH BLM) SUSP - pt states the pharmacy needs a phone call on how to mix the mouthwash. Please advise. Preferred Pharmacy (with phone number or street name): [facility] [phone] - [place], [place] - [address] [facility] [address]

## 2017-07-28 DIAGNOSIS — F411 Generalized anxiety disorder: Secondary | ICD-10-CM | POA: Diagnosis not present

## 2017-07-28 DIAGNOSIS — R69 Illness, unspecified: Secondary | ICD-10-CM | POA: Diagnosis not present

## 2017-08-16 ENCOUNTER — Encounter (HOSPITAL_COMMUNITY): Payer: Self-pay | Admitting: Emergency Medicine

## 2017-08-16 ENCOUNTER — Emergency Department (HOSPITAL_COMMUNITY)
Admission: EM | Admit: 2017-08-16 | Discharge: 2017-08-17 | Disposition: A | Discharge status: Home / Self Care | Payer: 59 | Attending: Emergency Medicine | Admitting: Emergency Medicine

## 2017-08-16 ENCOUNTER — Emergency Department (HOSPITAL_COMMUNITY): Payer: 59

## 2017-08-16 DIAGNOSIS — R0602 Shortness of breath: Secondary | ICD-10-CM | POA: Diagnosis not present

## 2017-08-16 DIAGNOSIS — Z79899 Other long term (current) drug therapy: Secondary | ICD-10-CM | POA: Diagnosis not present

## 2017-08-16 DIAGNOSIS — R0789 Other chest pain: Secondary | ICD-10-CM | POA: Diagnosis not present

## 2017-08-16 DIAGNOSIS — Z87891 Personal history of nicotine dependence: Secondary | ICD-10-CM | POA: Insufficient documentation

## 2017-08-16 DIAGNOSIS — R079 Chest pain, unspecified: Secondary | ICD-10-CM | POA: Diagnosis not present

## 2017-08-16 DIAGNOSIS — R002 Palpitations: Secondary | ICD-10-CM | POA: Diagnosis not present

## 2017-08-16 HISTORY — DX: Attention-deficit hyperactivity disorder, unspecified type: F90.9

## 2017-08-16 HISTORY — DX: Anxiety disorder, unspecified: F41.9

## 2017-08-16 LAB — CBC
HCT: 44.7 % (ref 39.0–52.0)
Hemoglobin: 15.1 g/dL (ref 13.0–17.0)
MCH: 31 pg (ref 26.0–34.0)
MCHC: 33.8 g/dL (ref 30.0–36.0)
MCV: 91.8 fL (ref 78.0–100.0)
Platelets: 199 10*3/uL (ref 150–400)
RBC: 4.87 MIL/uL (ref 4.22–5.81)
RDW: 12 % (ref 11.5–15.5)
WBC: 6.4 10*3/uL (ref 4.0–10.5)

## 2017-08-16 LAB — I-STAT TROPONIN, ED: Troponin i, poc: 0 ng/mL (ref 0.00–0.08)

## 2017-08-16 NOTE — ED Triage Notes (Signed)
BIB EMS from home reporting CP, SOB onset this evening while laying down. L sided with radiation down L arm, tight in nature. Hx of anxiety but states this does not feel like a panic attack.

## 2017-08-17 LAB — BASIC METABOLIC PANEL
Anion gap: 14 (ref 5–15)
BUN: 18 mg/dL (ref 6–20)
CO2: 22 mmol/L (ref 22–32)
Calcium: 9.4 mg/dL (ref 8.9–10.3)
Chloride: 100 mmol/L (ref 98–111)
Creatinine, Ser: 1 mg/dL (ref 0.61–1.24)
GFR calc Af Amer: >60 mL/min (ref >60)
GFR calc non Af Amer: >60 mL/min (ref >60)
Glucose, Bld: 95 mg/dL (ref 70–99)
Potassium: 3.6 mmol/L (ref 3.5–5.1)
Sodium: 136 mmol/L (ref 135–145)

## 2017-08-17 LAB — D-DIMER, QUANTITATIVE: D-Dimer, Quant: <0.27 ug/mL-FEU (ref 0.00–0.50)

## 2017-08-17 LAB — I-STAT TROPONIN, ED: Troponin i, poc: 0 ng/mL (ref 0.00–0.08)

## 2017-08-17 NOTE — ED Notes (Signed)
ED Provider at bedside. 

## 2017-08-17 NOTE — ED Notes (Signed)
Pt verbalizes understanding of d/c instructions. Pt ambulatory at d/c with all belongings and with family.   

## 2017-08-17 NOTE — ED Provider Notes (Signed)
[facility] EMERGENCY DEPARTMENT Provider Note  CSN: [id number] Arrival date & time: 08/16/17 2317  Chief Complaint(s) Chest Pain and Shortness of Breath  HPI Jeffrey English is a 37 y.o. male   The history is provided by the patient.  Chest Pain   This is a new problem. Episode onset: 4 hrs. The problem occurs constantly. The problem has been gradually improving. The pain is present in the substernal region. Quality: tightness. The pain radiates to the left arm. The symptoms are aggravated by deep breathing. Associated symptoms include palpitations and shortness of breath.  Shortness of Breath  Associated symptoms include chest pain.   Patient endorses pleuritic chest pain.  They report recent travel to the beach this week.  Denies any prior DVT/PEs.  No lower extremity pain or swelling.  No personal history of cancer.  No hormone replacement therapy.  Past Medical History Past Medical History:  Diagnosis Date  . ADHD   . Anxiety    Patient Active Problem List   Diagnosis Date Noted  . 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 Rectal bleeding 06/13/2013  . KNEE PAIN, RIGHT, ACUTE 11/26/2009  . 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 Authorizing Provider  ALPRAZolam Prudy Feeler) 1 MG tablet  06/20/15   [provider]  amphetamine-dextroamphetamine (ADDERALL XR) 15 MG 24 hr capsule Take 15 mg by mouth daily.    [provider]  amphetamine-dextroamphetamine (ADDERALL) 10 MG tablet Take 10 mg by mouth daily with breakfast. Reported on 04/23/2015    [provider]  DPH-Lido-AlHydr-MgHydr-Simeth (FIRST-MOUTHWASH BLM) SUSP 5 ml every 1-2h prn sore throat - swish gargle spit. 07/17/17   Weber, Dema Severin, PA-C  fluticasone (FLONASE) 50 MCG/ACT nasal spray instill 1 spray into each nostril EACH NIGHT 05/15/15   [provider]    HYDROcodone-homatropine (HYCODAN) 5-1.5 MG/5ML syrup Take 5 mLs by mouth every 8 (eight) hours as needed for cough. 07/17/17   Valarie Cones, Dema Severin, PA-C  omeprazole (PRILOSEC) 40 MG capsule  06/19/15   [provider]                                                                                                                                    Past Surgical History History reviewed. No pertinent surgical history. Family History Family History  Problem Relation Age of Onset  . Cancer Paternal Uncle 58       lung cancer    Social History Social History   Tobacco Use  . Smoking status: Former Smoker    Last attempt to quit: 06/13/2008    Years since quitting: 9.1  . Smokeless tobacco: Never Used  Substance Use Topics  . Alcohol use: Yes    Comment: occ  . Drug use: No   Allergies Patient has no known allergies.  Review  of Systems Review of Systems  Respiratory: Positive for shortness of breath.   Cardiovascular: Positive for chest pain and palpitations.   All other systems are reviewed and are negative for acute change except as noted in the HPI  Physical Exam Vital Signs  I have reviewed the triage vital signs BP (!) 141/98   Pulse 72   Temp 98.3 F (36.8 C) (Oral)   Resp 13   SpO2 98%   Physical Exam  Constitutional: He is oriented to person, place, and time. He appears well-developed and well-nourished. No distress.  HENT:  Head: Normocephalic and atraumatic.  Nose: Nose normal.  Eyes: Pupils are equal, round, and reactive to light. Conjunctivae and EOM are normal. Right eye exhibits no discharge. Left eye exhibits no discharge. No scleral icterus.  Neck: Normal range of motion. Neck supple.  Cardiovascular: Normal rate and regular rhythm. Exam reveals no gallop and no friction rub.  No murmur heard. Pulmonary/Chest: Effort normal and breath sounds normal. No stridor. No respiratory distress. He has no rales.  Abdominal: Soft. He exhibits no distension. There  is no tenderness.  Musculoskeletal: He exhibits no edema or tenderness.  Neurological: He is alert and oriented to person, place, and time.  Skin: Skin is warm and dry. No rash noted. He is not diaphoretic. No erythema.  Psychiatric: He has a normal mood and affect.  Vitals reviewed.   ED Results and Treatments Labs (all labs ordered are listed, but only abnormal results are displayed) Labs Reviewed  BASIC METABOLIC PANEL  CBC  D-DIMER, QUANTITATIVE (NOT AT [facility])  I-STAT TROPONIN, ED  I-STAT TROPONIN, ED                                                                                                                         EKG  EKG Interpretation  Date/Time:  Sunday August 16 2017 23:23:02 EDT Ventricular Rate:  84 PR Interval:  152 QRS Duration: 88 QT Interval:  358 QTC Calculation: 423 R Axis:   6 Text Interpretation:  Normal sinus rhythm Normal ECG NO STEMI No old tracing to compare Confirmed by Drema Pry [phone]) on 08/17/2017 2:16:09 AM      Radiology Dg Chest 2 View  Result Date: 08/16/2017 CLINICAL DATA:  Left-sided chest pain.  Shortness of breath. EXAM: CHEST - 2 VIEW COMPARISON:  None. FINDINGS: The cardiomediastinal contours are normal. The lungs are clear. Pulmonary vasculature is normal. No consolidation, pleural effusion, or pneumothorax. No acute osseous abnormalities are seen. IMPRESSION: Unremarkable radiographs of the chest. Electronically Signed   By: Rubye Oaks M.D.   On: 08/16/2017 23:47   Pertinent labs & imaging results that were available during my care of the patient were reviewed by me and considered in my medical decision making (see chart for details).  Medications Ordered in ED Medications - No data to display  Procedures Procedures  (including critical care time)  Medical Decision Making / ED Course I have  reviewed the nursing notes for this encounter and the patient's prior records (if available in EHR or on provided paperwork).    Atypical chest pain highly inconsistent with ACS.  Heart score less than 4.  EKG without acute ischemic changes or evidence of pericarditis.  Initial troponin negative.  Appropriate for delta troponin, which was negative.  Low suspicion [facility] pulmonary [facility]ism but [facility]  D-dimer obtained and negative.  Low suspicion for PE.  Presentation not classic for aortic dissection or esophageal perforation.  Chest x-ray without evidence suggestive of pneumonia, pneumothorax, pneumomediastinum.  No abnormal contour of the mediastinum to suggest dissection. No evidence of acute injuries.  Possible GI versus anxiety.  Patient declined GI cocktail.  Final Clinical Impression(s) / ED Diagnoses Final diagnoses:  Atypical chest pain   Disposition: Discharge  Condition: Good  I have discussed the results, Dx and Tx plan with the patient who expressed understanding and agree(s) with the plan. Discharge instructions discussed at great length. The patient was given strict return precautions who verbalized understanding of the instructions. No further questions at time of discharge.    ED Discharge Orders    None       Follow Up: Blair HeysEhinger, Robert, MD [address] [place] [address] [phone]  Schedule an appointment as soon as possible for a visit  As needed      This chart was dictated using voice recognition software.  Despite best efforts to proofread,  errors can occur which can change the documentation meaning.   Nira Connardama, Cordarious Zeek Eduardo, MD 08/17/17 [phone]

## 2017-08-25 DIAGNOSIS — R0789 Other chest pain: Secondary | ICD-10-CM | POA: Diagnosis not present

## 2017-09-01 DIAGNOSIS — R5383 Other fatigue: Secondary | ICD-10-CM | POA: Diagnosis not present

## 2017-09-01 DIAGNOSIS — R69 Illness, unspecified: Secondary | ICD-10-CM | POA: Diagnosis not present

## 2017-09-01 DIAGNOSIS — E538 Deficiency of other specified B group vitamins: Secondary | ICD-10-CM | POA: Diagnosis not present

## 2017-09-01 DIAGNOSIS — F988 Other specified behavioral and emotional disorders with onset usually occurring in childhood and adolescence: Secondary | ICD-10-CM | POA: Diagnosis not present

## 2017-09-01 DIAGNOSIS — E559 Vitamin D deficiency, unspecified: Secondary | ICD-10-CM | POA: Diagnosis not present

## 2017-10-20 ENCOUNTER — Encounter (INDEPENDENT_AMBULATORY_CARE_PROVIDER_SITE_OTHER): Payer: Self-pay | Admitting: Orthopaedic Surgery

## 2017-10-20 ENCOUNTER — Ambulatory Visit (INDEPENDENT_AMBULATORY_CARE_PROVIDER_SITE_OTHER): Payer: 59 | Admitting: Orthopaedic Surgery

## 2017-10-20 DIAGNOSIS — M25521 Pain in right elbow: Secondary | ICD-10-CM | POA: Diagnosis not present

## 2017-10-20 NOTE — Progress Notes (Signed)
   Office Visit Note   Patient: Jeffrey English           Date of Birth: [birth year]           MRN: [id number] Visit Date: 10/20/2017              Requested by: Blair Heys, MD [address] [place], [place] [address] PCP: Blair Heys, MD   Assessment & Plan: Visit Diagnoses:  1. Pain in right elbow     Plan: Impression is right lateral epicondylitis with mild medial epicondylitis.  We discussed conservative treatment options including rest, ice, home exercise program, pennsaid sample, Duexis.  Overall I do not feel that this is bad enough to warrant a cortisone injection.  I would recommend resting this from tennis until feels better.  We did provide him with a elbow compression sleeve and a counterforce brace.  Referral to outpatient physical therapy was given case he wants to try it.  Questions encouraged and answered.  Follow-up as needed.  Follow-Up Instructions: Return if symptoms worsen or fail to improve.   Orders:  No orders of the defined types were placed in this encounter.  No orders of the defined types were placed in this encounter.     Procedures: No procedures performed   Clinical Data: No additional findings.   Subjective: Chief Complaint  Patient presents with  . Right Elbow - Pain    Jacion is a friend of mine who I play tennis with who comes in with 1 week of right lateral elbow pain with some mild medial elbow pain.  He previously had a bout of medial epicondylitis which took a significant amount of time for it to resolve.  He states that the pain is worse with rowing and with playing tennis.  Denies any numbness and tingling.   Review of Systems   Objective: Vital Signs: There were no vitals taken for this visit.  Physical Exam  Ortho Exam Right elbow exam shows no swelling or neurovascular compromise.  He has some mild tenderness of the lateral epicondyle in the common extensor origin.  Medial epicondyle is not significantly  tender.  He does have discomfort with resisted forearm supination and with wrist extension.  No pain with resisted ECRB extension. Specialty Comments:  No specialty comments available.  Imaging: No results found.   PMFS History: Patient Active Problem List   Diagnosis Date Noted  . 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 Rectal bleeding 06/13/2013  . KNEE PAIN, RIGHT, ACUTE 11/26/2009  . 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 ADHD   . Anxiety     Family History  Problem Relation Age of Onset  . Cancer Paternal Uncle 72       lung cancer    History reviewed. No pertinent surgical history. Social History   Occupational History  . Not on file  Tobacco Use  . Smoking status: Former Smoker    Last attempt to quit: 06/13/2008    Years since quitting: 9.3  . Smokeless tobacco: Never Used  Substance and Sexual Activity  . Alcohol use: Yes    Comment: occ  . Drug use: No  . Sexual activity: Not on file

## 2017-10-29 DIAGNOSIS — R5383 Other fatigue: Secondary | ICD-10-CM | POA: Diagnosis not present

## 2017-10-29 DIAGNOSIS — F419 Anxiety disorder, unspecified: Secondary | ICD-10-CM | POA: Diagnosis not present

## 2017-10-29 DIAGNOSIS — E559 Vitamin D deficiency, unspecified: Secondary | ICD-10-CM | POA: Diagnosis not present

## 2017-10-29 DIAGNOSIS — R7989 Other specified abnormal findings of blood chemistry: Secondary | ICD-10-CM | POA: Diagnosis not present

## 2017-10-29 DIAGNOSIS — E538 Deficiency of other specified B group vitamins: Secondary | ICD-10-CM | POA: Diagnosis not present

## 2017-10-29 DIAGNOSIS — R748 Abnormal levels of other serum enzymes: Secondary | ICD-10-CM | POA: Diagnosis not present

## 2017-10-29 DIAGNOSIS — E7212 Methylenetetrahydrofolate reductase deficiency: Secondary | ICD-10-CM | POA: Diagnosis not present

## 2017-10-29 DIAGNOSIS — R69 Illness, unspecified: Secondary | ICD-10-CM | POA: Diagnosis not present

## 2017-11-03 ENCOUNTER — Other Ambulatory Visit (INDEPENDENT_AMBULATORY_CARE_PROVIDER_SITE_OTHER): Payer: Self-pay | Admitting: Orthopaedic Surgery

## 2017-11-03 MED ORDER — IBUPROFEN-FAMOTIDINE 800-26.6 MG PO TABS: 1.0000 | ORAL_TABLET | Freq: Three times a day (TID) | ORAL | 0 refills | Status: DC | PRN

## 2017-11-04 DIAGNOSIS — M25521 Pain in right elbow: Secondary | ICD-10-CM | POA: Diagnosis not present

## 2017-12-01 ENCOUNTER — Other Ambulatory Visit (INDEPENDENT_AMBULATORY_CARE_PROVIDER_SITE_OTHER): Payer: Self-pay | Admitting: Orthopaedic Surgery

## 2017-12-01 MED ORDER — DICLOFENAC SODIUM 2 % TD SOLN: 2.0000 g | Freq: Two times a day (BID) | TRANSDERMAL | 3 refills | Status: DC | PRN

## 2017-12-24 ENCOUNTER — Other Ambulatory Visit (INDEPENDENT_AMBULATORY_CARE_PROVIDER_SITE_OTHER): Payer: Self-pay | Admitting: Orthopaedic Surgery

## 2017-12-24 MED ORDER — NITROGLYCERIN 0.2 MG/HR TD PT24: MEDICATED_PATCH | TRANSDERMAL | 12 refills | Status: DC

## 2018-01-25 DIAGNOSIS — R5383 Other fatigue: Secondary | ICD-10-CM | POA: Diagnosis not present

## 2018-01-25 DIAGNOSIS — E039 Hypothyroidism, unspecified: Secondary | ICD-10-CM | POA: Diagnosis not present

## 2018-01-25 DIAGNOSIS — R7989 Other specified abnormal findings of blood chemistry: Secondary | ICD-10-CM | POA: Diagnosis not present

## 2018-01-25 DIAGNOSIS — E559 Vitamin D deficiency, unspecified: Secondary | ICD-10-CM | POA: Diagnosis not present

## 2018-01-25 DIAGNOSIS — E538 Deficiency of other specified B group vitamins: Secondary | ICD-10-CM | POA: Diagnosis not present

## 2018-02-15 DIAGNOSIS — R748 Abnormal levels of other serum enzymes: Secondary | ICD-10-CM | POA: Diagnosis not present

## 2018-02-15 DIAGNOSIS — E538 Deficiency of other specified B group vitamins: Secondary | ICD-10-CM | POA: Diagnosis not present

## 2018-02-15 DIAGNOSIS — E559 Vitamin D deficiency, unspecified: Secondary | ICD-10-CM | POA: Diagnosis not present

## 2018-02-15 DIAGNOSIS — R5383 Other fatigue: Secondary | ICD-10-CM | POA: Diagnosis not present

## 2018-02-15 DIAGNOSIS — F419 Anxiety disorder, unspecified: Secondary | ICD-10-CM | POA: Diagnosis not present

## 2018-02-15 DIAGNOSIS — R69 Illness, unspecified: Secondary | ICD-10-CM | POA: Diagnosis not present

## 2018-02-15 DIAGNOSIS — E039 Hypothyroidism, unspecified: Secondary | ICD-10-CM | POA: Diagnosis not present

## 2018-02-15 DIAGNOSIS — R7989 Other specified abnormal findings of blood chemistry: Secondary | ICD-10-CM | POA: Diagnosis not present

## 2018-06-22 ENCOUNTER — Other Ambulatory Visit: Payer: Self-pay

## 2018-06-22 ENCOUNTER — Ambulatory Visit: Payer: Commercial Managed Care - PPO | Admitting: Orthopaedic Surgery

## 2018-06-22 ENCOUNTER — Ambulatory Visit: Payer: Self-pay

## 2018-06-22 ENCOUNTER — Encounter: Payer: Self-pay | Admitting: Orthopaedic Surgery

## 2018-06-22 DIAGNOSIS — M79672 Pain in left foot: Secondary | ICD-10-CM

## 2018-06-22 NOTE — Progress Notes (Signed)
Office Visit Note   Patient: Jeffrey English           Date of Birth: February 14, 1980           MRN: [id number] Visit Date: 06/22/2018              Requested by: Blair Heys, MD [address] [place], [place] [address] PCP: Blair Heys, MD   Assessment & Plan: Visit Diagnoses:  1. Pain of left heel     Plan: Impression is left plantar fasciitis.  Home exercises were demonstrated today.  Overall education was provided on the condition.  Questions encouraged and answered.  Follow-up as needed.  Follow-Up Instructions: Return if symptoms worsen or fail to improve.   Orders:  Orders Placed This Encounter  Procedures  . XR Os Calcis Left   No orders of the defined types were placed in this encounter.     Procedures: No procedures performed   Clinical Data: No additional findings.   Subjective: Chief Complaint  Patient presents with  . Left Foot - Pain    LEFT HEEL PAIN     Jeffrey English is a healthy 38 year old who comes in with acute worsening left heel pain for the last 2 to 3 weeks.  The pain is pretty much directly underneath the central portion of the heel.  It does not radiate.  It is worse in the morning and gets better by the time he leaves his house.  However the pain has started to linger more throughout the day.  He has taken anti-inflammatories occasionally.   Review of Systems  Constitutional: Negative.   All other systems reviewed and are negative.    Objective: Vital Signs: There were no vitals taken for this visit.  Physical Exam Vitals signs and nursing note reviewed.  Constitutional:      Appearance: He is well-developed.  Pulmonary:     Effort: Pulmonary effort is normal.  Abdominal:     Palpations: Abdomen is soft.  Skin:    General: Skin is warm.  Neurological:     Mental Status: He is alert and oriented to person, place, and time.  Psychiatric:        Behavior: Behavior normal.        Thought Content: Thought content normal.         Judgment: Judgment normal.     Ortho Exam Left heel exam shows mild to moderate tenderness with palpation at the calcaneal insertion of the plantar fascia.  Squeeze test is negative.  Insertion of the Achilles is unremarkable.  Posterior tibial tendon is unremarkable.   Specialty Comments:  No specialty comments available.  Imaging: Xr Os Calcis Left  Result Date: 06/22/2018 No acute or structural abnormalities.    PMFS History: Patient Active Problem List   Diagnosis Date Noted  . 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 Rectal bleeding 06/13/2013  . KNEE PAIN, RIGHT, ACUTE 11/26/2009  . 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 ADHD   . Anxiety     Family History  Problem Relation Age of Onset  . Cancer Paternal Uncle 3       lung cancer    History reviewed. No pertinent surgical history. Social History   Occupational History  . Not on file  Tobacco Use  . Smoking status: Former Smoker    Last attempt to quit: 06/13/2008    Years since quitting: 10.0  .  Smokeless tobacco: Never Used  Substance and Sexual Activity  . Alcohol use: Yes    Comment: occ  . Drug use: No  . Sexual activity: Not on file

## 2018-12-20 ENCOUNTER — Other Ambulatory Visit: Payer: Self-pay | Admitting: Family Medicine

## 2018-12-21 ENCOUNTER — Other Ambulatory Visit: Payer: Self-pay

## 2018-12-21 DIAGNOSIS — R0602 Shortness of breath: Secondary | ICD-10-CM

## 2018-12-21 DIAGNOSIS — R079 Chest pain, unspecified: Secondary | ICD-10-CM

## 2018-12-21 MED ORDER — METOPROLOL TARTRATE 50 MG PO TABS: ORAL_TABLET | ORAL | 0 refills | Status: DC

## 2018-12-21 NOTE — Progress Notes (Signed)
Per Dr Gwenlyn Found, asked to put in Coronary CT orders.

## 2018-12-24 ENCOUNTER — Other Ambulatory Visit (HOSPITAL_COMMUNITY): Payer: Commercial Managed Care - PPO

## 2018-12-24 ENCOUNTER — Encounter (HOSPITAL_COMMUNITY): Payer: Commercial Managed Care - PPO

## 2018-12-29 ENCOUNTER — Other Ambulatory Visit: Payer: Self-pay | Admitting: Cardiovascular Disease

## 2019-01-05 ENCOUNTER — Telehealth: Payer: Self-pay

## 2019-01-05 ENCOUNTER — Other Ambulatory Visit: Payer: Self-pay

## 2019-01-05 DIAGNOSIS — R079 Chest pain, unspecified: Secondary | ICD-10-CM

## 2019-01-05 LAB — BASIC METABOLIC PANEL
BUN/Creatinine Ratio: 18 (ref 9–20)
BUN: 21 mg/dL (ref 6–24)
CO2: 24 mmol/L (ref 20–29)
Calcium: 9.4 mg/dL (ref 8.7–10.2)
Chloride: 100 mmol/L (ref 96–106)
Creatinine, Ser: 1.17 mg/dL (ref 0.76–1.27)
GFR calc Af Amer: 90 mL/min/{1.73_m2} (ref >59)
GFR calc non Af Amer: 78 mL/min/{1.73_m2} (ref >59)
Glucose: 81 mg/dL (ref 65–99)
Potassium: 4.8 mmol/L (ref 3.5–5.2)
Sodium: 138 mmol/L (ref 134–144)

## 2019-01-05 NOTE — Telephone Encounter (Signed)
Received My Chart notification that pt has unread message re: CT instructions.  Attempted to call pt.  No answer and no voicemail to leave message.

## 2019-01-07 ENCOUNTER — Telehealth (HOSPITAL_COMMUNITY): Payer: Self-pay | Admitting: Emergency Medicine

## 2019-01-07 NOTE — Telephone Encounter (Signed)
Reaching out to patient to offer assistance regarding upcoming cardiac imaging study; pt verbalizes understanding of appt date/time, parking situation and where to check in, pre-test NPO status and medications ordered, and verified current allergies; name and call back number provided for further questions should they arise Dionicia Cerritos RN Navigator Cardiac Imaging [facility] [phone] office [phone] cell 

## 2019-01-10 ENCOUNTER — Other Ambulatory Visit: Payer: Self-pay

## 2019-01-10 ENCOUNTER — Ambulatory Visit (HOSPITAL_COMMUNITY)
Admission: RE | Admit: 2019-01-10 | Discharge: 2019-01-10 | Disposition: A | Discharge status: Home / Self Care | Payer: Commercial Managed Care - PPO | Source: Ambulatory Visit | Attending: Cardiovascular Disease | Admitting: Cardiovascular Disease

## 2019-01-10 DIAGNOSIS — R079 Chest pain, unspecified: Secondary | ICD-10-CM

## 2019-01-10 DIAGNOSIS — R0602 Shortness of breath: Secondary | ICD-10-CM

## 2019-01-10 MED ORDER — IOHEXOL 350 MG/ML SOLN
80.0000 mL | Freq: Once | INTRAVENOUS | Status: AC | PRN
  Administered 2019-01-10: 80 mL via INTRAVENOUS

## 2019-01-10 MED ORDER — NITROGLYCERIN 0.4 MG SL SUBL
SUBLINGUAL_TABLET | SUBLINGUAL | Status: AC
  Filled 2019-01-10: qty 2

## 2019-01-10 MED ORDER — NITROGLYCERIN 0.4 MG SL SUBL
0.8000 mg | SUBLINGUAL_TABLET | Freq: Once | SUBLINGUAL | Status: AC
  Administered 2019-01-10: 0.8 mg via SUBLINGUAL

## 2019-02-22 ENCOUNTER — Ambulatory Visit: Payer: Commercial Managed Care - PPO | Attending: Internal Medicine

## 2019-02-22 DIAGNOSIS — Z20822 Contact with and (suspected) exposure to covid-19: Secondary | ICD-10-CM

## 2019-02-23 ENCOUNTER — Other Ambulatory Visit: Payer: Commercial Managed Care - PPO

## 2019-02-24 LAB — NOVEL CORONAVIRUS, NAA: SARS-CoV-2, NAA: NOT DETECTED

## 2019-03-16 ENCOUNTER — Ambulatory Visit: Payer: Commercial Managed Care - PPO | Attending: Internal Medicine

## 2019-03-16 DIAGNOSIS — Z20822 Contact with and (suspected) exposure to covid-19: Secondary | ICD-10-CM

## 2019-03-17 LAB — NOVEL CORONAVIRUS, NAA: SARS-CoV-2, NAA: NOT DETECTED

## 2019-06-14 ENCOUNTER — Other Ambulatory Visit: Payer: Self-pay

## 2019-06-14 ENCOUNTER — Other Ambulatory Visit: Payer: Self-pay | Admitting: Surgery

## 2019-06-14 ENCOUNTER — Encounter (HOSPITAL_COMMUNITY): Payer: Self-pay | Admitting: *Deleted

## 2019-06-14 ENCOUNTER — Emergency Department (HOSPITAL_COMMUNITY)
Admission: EM | Admit: 2019-06-14 | Discharge: 2019-06-14 | Disposition: A | Discharge status: Home / Self Care | Payer: Commercial Managed Care - PPO | Attending: Emergency Medicine | Admitting: Emergency Medicine

## 2019-06-14 DIAGNOSIS — S61212A Laceration without foreign body of right middle finger without damage to nail, initial encounter: Secondary | ICD-10-CM

## 2019-06-14 DIAGNOSIS — Z87891 Personal history of nicotine dependence: Secondary | ICD-10-CM | POA: Insufficient documentation

## 2019-06-14 DIAGNOSIS — W260XXA Contact with knife, initial encounter: Secondary | ICD-10-CM | POA: Insufficient documentation

## 2019-06-14 DIAGNOSIS — Z79899 Other long term (current) drug therapy: Secondary | ICD-10-CM | POA: Diagnosis not present

## 2019-06-14 DIAGNOSIS — Y929 Unspecified place or not applicable: Secondary | ICD-10-CM | POA: Diagnosis not present

## 2019-06-14 DIAGNOSIS — Y9389 Activity, other specified: Secondary | ICD-10-CM | POA: Diagnosis not present

## 2019-06-14 DIAGNOSIS — Y999 Unspecified external cause status: Secondary | ICD-10-CM | POA: Diagnosis not present

## 2019-06-14 MED ORDER — BUPIVACAINE HCL (PF) 0.5 % IJ SOLN
10.0000 mL | Freq: Once | INTRAMUSCULAR | Status: AC
Start: 2019-06-14 — End: 2019-06-14
  Administered 2019-06-14: 06:00:00 10 mL
  Filled 2019-06-14: qty 30

## 2019-06-14 MED ORDER — LIDOCAINE HCL (PF) 1 % IJ SOLN
10.0000 mL | Freq: Once | INTRAMUSCULAR | Status: AC
  Administered 2019-06-14: 06:00:00 10 mL
  Filled 2019-06-14: qty 30

## 2019-06-14 MED ORDER — CEPHALEXIN 500 MG PO CAPS: 500.0000 mg | ORAL_CAPSULE | Freq: Two times a day (BID) | ORAL | 0 refills | Status: AC

## 2019-06-14 NOTE — ED Triage Notes (Signed)
Pt cut his right hand middle finger with a knife around 2100, could not get it to stop bleeding. Assessed in triage, mild oozing, wound redressed. Unknown tetanus.

## 2019-06-14 NOTE — Discharge Instructions (Signed)

## 2019-06-14 NOTE — ED Notes (Signed)
Lidocaine and bupivacaine at bedside.

## 2019-06-14 NOTE — ED Provider Notes (Signed)
[facility] Provider Note   CSN: [id number] Arrival date & time: 06/14/19  0053     History Chief Complaint  Patient presents with  . Laceration    Jeffrey English is a 39 y.o. male with a past medical history of ADHD, anxiety, who presents today for evaluation of a right middle finger laceration.  He reports that at about 9 PM he was cleaning a knife and cut the ventral surface of his right middle finger.  He does not know when his last tetanus shot was.  He originally wrapped it up and went to bed however he woke up and it was bleeding causing him to seek medical care.  He does not take any blood thinning medications.  He is right-hand dominant.  He denies any numbness or weakness.  HPI     Past Medical History:  Diagnosis Date  . ADHD   . Anxiety     Patient Active Problem List   Diagnosis Date Noted  . 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 Rectal bleeding 06/13/2013  . KNEE PAIN, RIGHT, ACUTE 11/26/2009  . 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 No pertinent surgical history.     Family History  Problem Relation Age of Onset  . Cancer Paternal Uncle 106       lung cancer    Social History   Tobacco Use  . Smoking status: Former Smoker    Quit date: 06/13/2008    Years since quitting: 11.0  . Smokeless tobacco: Never Used  Substance Use Topics  . Alcohol use: Yes    Comment: occ  . Drug use: No    Home Medications Prior to Admission medications   Medication Sig Start Date End Date Taking? Authorizing Provider  ALPRAZolam Prudy Feeler) 1 MG tablet  06/20/15   [provider]  amphetamine-dextroamphetamine (ADDERALL XR) 15 MG 24 hr capsule Take 15 mg by mouth daily.    [provider]  amphetamine-dextroamphetamine (ADDERALL) 10 MG tablet Take 10 mg by mouth daily with breakfast. Reported on 04/23/2015    [provider]  cephALEXin (KEFLEX) 500 MG capsule Take  1 capsule (500 mg total) by mouth 2 (two) times daily for 7 days. 06/14/19 06/21/19  Cristina Gong, PA-C  Diclofenac Sodium (PENNSAID) 2 % SOLN Apply 2 g topically 2 (two) times daily as needed (to affected area). 12/01/17   Tarry Kos, MD  DPH-Lido-AlHydr-MgHydr-Simeth (FIRST-MOUTHWASH BLM) SUSP 5 ml every 1-2h prn sore throat - swish gargle spit. 07/17/17   Weber, Dema Severin, PA-C  fluticasone (FLONASE) 50 MCG/ACT nasal spray instill 1 spray into each nostril EACH NIGHT 05/15/15   [provider]  HYDROcodone-homatropine (HYCODAN) 5-1.5 MG/5ML syrup Take 5 mLs by mouth every 8 (eight) hours as needed for cough. 07/17/17   Weber, Sarah L, PA-C  Ibuprofen-Famotidine 800-26.6 MG TABS Take 1 tablet by mouth 3 (three) times daily as needed. 11/03/17   Tarry Kos, MD  metoprolol tartrate (LOPRESSOR) 50 MG tablet Take 2 hours prior to procedure 12/21/18   Runell Gess, MD  nitroGLYCERIN (NITRODUR - DOSED IN MG/24 HR) 0.2 mg/hr patch Apply 1/4 patch to area daily prn 12/24/17   Tarry Kos, MD  omeprazole (PRILOSEC) 40 MG capsule  06/19/15   [provider]    Allergies    Patient has no known allergies.  Review of Systems   Review of Systems  Constitutional: Negative for chills and  fever.  Skin: Positive for wound.  Neurological: Negative for weakness and numbness.  All other systems reviewed and are negative.   Physical Exam Updated Vital Signs BP (!) 159/115 (BP Location: Right Arm)   Pulse 71   Temp 98.5 F (36.9 C) (Oral)   Resp 16   Ht 6' (1.829 m)   Wt 98.4 kg   SpO2 97%   BMI 29.43 kg/m   Physical Exam Vitals and nursing note reviewed.  Constitutional:      General: He is not in acute distress.    Appearance: He is well-developed. He is not diaphoretic.  HENT:     Head: Normocephalic and atraumatic.  Eyes:     General: No scleral icterus.       Right eye: No discharge.        Left eye: No discharge.     Conjunctiva/sclera: Conjunctivae normal.    Cardiovascular:     Rate and Rhythm: Normal rate.  Pulmonary:     Effort: Pulmonary effort is normal. No respiratory distress.     Breath sounds: No stridor.  Abdominal:     General: There is no distension.  Musculoskeletal:        General: No deformity.     Comments: Patient has full flexion and extension of the right long finger.  Skin:    General: Skin is warm and dry.     Comments: There is a 1cm U shaped laceration across the dorsum of the right long finger between the DIP and PIP joints.   Capillary refill under 2 seconds to right distal long finger.   Neurological:     Mental Status: He is alert.     Motor: No abnormal muscle tone.     Comments: Sensation is intact to the right long finger.  Psychiatric:        Behavior: Behavior normal.         ED Results / Procedures / Treatments   Labs (all labs ordered are listed, but only abnormal results are displayed) Labs Reviewed - No data to display  EKG None  Radiology No results found.  Procedures .Marland KitchenLaceration Repair  Date/Time: 06/14/2019 7:07 AM Performed by: Cristina Gong, PA-C Authorized by: Cristina Gong, PA-C   Consent:    Consent obtained:  Verbal   Consent given by:  Patient   Risks discussed:  Infection, need for additional repair, poor cosmetic result, pain, retained foreign body, tendon damage, vascular damage, poor wound healing and nerve damage   Alternatives discussed:  No treatment and referral (Alternative wound closures) Anesthesia (see MAR for exact dosages):    Anesthesia method:  Nerve block   Block location:  Right long finger   Block needle gauge:  25 G   Block anesthetic: 50-50 mixture of bupivacaine 0.5% without epi and lidocaine 1% without epi.   Block technique:  Web space block bilaterally.    Block injection procedure:  Anatomic landmarks identified, introduced needle, incremental injection, anatomic landmarks palpated and negative aspiration for blood   Block  outcome:  Incomplete block (repeat block with injection on the palmar aspect was sucessful. ) Laceration details:    Length (cm):  1 Repair type:    Repair type:  Simple Pre-procedure details:    Preparation:  Patient was prepped and draped in usual sterile fashion (Patient refused imaging. ) Exploration:    Hemostasis achieved with:  Tourniquet and direct pressure   Wound exploration: wound explored through full range of motion and  entire depth of wound probed and visualized     Wound extent: no foreign bodies/material noted, no nerve damage noted and no tendon damage noted   Treatment:    Area cleansed with:  Saline   Amount of cleaning:  Standard Skin repair:    Repair method:  Sutures   Suture size:  5-0   Suture material:  Prolene   Suture technique:  Simple interrupted and horizontal mattress   Number of sutures:  3 Approximation:    Approximation:  Close Post-procedure details:    Dressing:  Non-adherent dressing, splint for protection and bulky dressing   Patient tolerance of procedure:  Tolerated well, no immediate complications .Splint Application  Date/Time: 06/14/2019 7:09 AM Performed by: Lorin Glass, PA-C Authorized by: Lorin Glass, PA-C   Consent:    Consent obtained:  Verbal   Consent given by:  Patient   Risks discussed:  Discoloration, numbness, swelling and pain   Alternatives discussed:  No treatment, alternative treatment and referral Pre-procedure details:    Pre-procedure CMS: Prior to digital block was normal.   Skin color:  Normal to unaffected fingers Procedure details:    Laterality:  Right   Location:  Finger   Finger:  R long finger   Supplies:  Aluminum splint Post-procedure details:    Pain:  Unchanged   Sensation:  Unchanged   Skin color:  Unchanged   Patient tolerance of procedure:  Tolerated well, no immediate complications Comments:     Splint for treatment of soft tissue injury.    (including critical care  time)  Medications Ordered in ED Medications  lidocaine (PF) (XYLOCAINE) 1 % injection 10 mL (10 mLs Infiltration Given by Other 06/14/19 0539)  bupivacaine (MARCAINE) 0.5 % injection 10 mL (10 mLs Infiltration Given by Other 06/14/19 8657)    ED Course  I have reviewed the triage vital signs and the nursing notes.  Pertinent labs & imaging results that were available during my care of the patient were reviewed by me and considered in my medical decision making (see chart for details).  Clinical Course as of Jun 14 703  Tue Jun 14, 2019  0500 On recheck patient has some decreased sensation however is not fully blocked.  Repeat block in both webspaces and on the palmar aspect was performed.   [EH]    Clinical Course User Index [EH] Ollen Gross   MDM Rules/Calculators/A&P                     Patient is a 39 year old man who presents today for evaluation of a laceration to his right long finger that occurred approximately 4 to 5 hours prior to arrival.  On exam he is neurovascularly intact without evidence of tendon injury with full active range of motion.  I discussed with the patient the role of x-rays including evaluation of foreign bodies.  We discussed a higher risk of retained foreign bodies without x-rays however patient refused x-rays after we discussed risks of this. Digital block was performed.  Wound was irrigated, base of wound viewed in a bloodless field without evidence of retained foreign bodies.    Tdap is updated.    Will place on low dose keflex for prophylaxis given the laceration location.    Patient's blood pressure was elevated while in the emergency room.  He states that he has a general wellness appointment scheduled later this week and will get it rechecked then.  Offered patient a  prescription for narcotic pain medicine however he refused.  Suture removal in 10 to 14 days.  Given the location he is given a splint with instructions to minimize  flexion of the finger.  Suture home care discussed.   Return precautions were discussed with patient who states their understanding.  At the time of discharge patient denied any unaddressed complaints or concerns.  Patient is agreeable for discharge home.  Note: Portions of this report may have been transcribed using voice recognition software. Every effort was made to ensure accuracy; however, inadvertent computerized transcription errors may be present  Final Clinical Impression(s) / ED Diagnoses Final diagnoses:  Laceration of right middle finger without foreign body without damage to nail, initial encounter    Rx / DC Orders ED Discharge Orders         Ordered    cephALEXin (KEFLEX) 500 MG capsule  2 times daily     06/14/19 0539           Cristina Gong, PA-C 06/14/19 8413    Dione Booze, MD 06/14/19 [phone]

## 2019-06-22 ENCOUNTER — Encounter (HOSPITAL_BASED_OUTPATIENT_CLINIC_OR_DEPARTMENT_OTHER): Payer: Self-pay | Admitting: Surgery

## 2019-06-22 ENCOUNTER — Other Ambulatory Visit: Payer: Self-pay

## 2019-06-23 ENCOUNTER — Other Ambulatory Visit (HOSPITAL_COMMUNITY)
Admission: RE | Admit: 2019-06-23 | Discharge: 2019-06-23 | Disposition: A | Discharge status: Home / Self Care | Payer: Commercial Managed Care - PPO | Source: Ambulatory Visit | Attending: Surgery | Admitting: Surgery

## 2019-06-23 DIAGNOSIS — Z01812 Encounter for preprocedural laboratory examination: Secondary | ICD-10-CM | POA: Insufficient documentation

## 2019-06-23 DIAGNOSIS — Z20822 Contact with and (suspected) exposure to covid-19: Secondary | ICD-10-CM | POA: Diagnosis not present

## 2019-06-23 LAB — SARS CORONAVIRUS 2 (TAT 6-24 HRS): SARS Coronavirus 2: NEGATIVE

## 2019-06-24 MED ORDER — ENSURE PRE-SURGERY PO LIQD: 296.0000 mL | Freq: Once | ORAL | Status: DC

## 2019-06-24 NOTE — Progress Notes (Signed)

## 2019-06-26 NOTE — H&P (Signed)
   [name] Appointment: 06/14/2019 9:40 AM Location: [facility] Patient #: [id number] DOB: November 22, 1980 Married / Language: English / Race: Refused to Report/Unreported Male   History of Present Illness (Sunny Aguon A. Magnus Ivan MD; 06/14/2019 9:32 AM) The patient is a 39 year old male who presents with an inguinal hernia.  Chief complaint: Left inguinal bulge  History: This is a 39 year old gentleman who is a self-referral. He has been having increasing discomfort in the left inguinal area while playing tennis and has noticed a reducible bulge. The pain is mild in intensity. It does go down the leg slightly. He is otherwise healthy without complaints. He denies trauma to the area. He has no previous history of abdominal surgery.   Allergies Doristine Devoid, CMA; 06/14/2019 9:17 AM) No Known Drug Allergies  [10/18/2013]:  Medication History Doristine Devoid, CMA; 06/14/2019 9:17 AM) Xanax (1MG  Tablet, Oral as needed) Active. Adderall XR (10MG  Capsule ER 24HR, Oral daily) Active. Medications Reconciled  Vitals (Chemira Jones CMA; 06/14/2019 9:17 AM) 06/14/2019 9:17 AM Weight: 220.2 lb Height: 72in Body Surface Area: 2.22 m Body Mass Index: 29.86 kg/m  Pulse: 85 (Regular)  BP: 140/84(Sitting, Left Arm, Standard)       Physical Exam (Anali Cabanilla A. 08/14/2019 MD; 06/14/2019 9:34 AM) The physical exam findings are as follows: Note: He appears well on exam  There is an easily reducible left inguinal hernia without evidence of a right inguinal hernia or umbilical hernia    Assessment & Plan (Kabrina Christiano A. Magnus Ivan MD; 06/14/2019 9:35 AM) LEFT INGUINAL HERNIA (K40.90) Impression: At this point, had a long discussion the patient regarding inguinal hernias. We discussed abdominal wall anatomy. We discussed the reasons proceed with hernia repair. I explained that the hernia wall and a larger time and there is a small risk of incarceration and strangulation. We discussed signs  and symptoms of this. I next discussed inguinal hernia repair with mesh. We discussed the reasons for using mesh. I then discussed both the laparoscopic and open techniques. At this point he wishes to proceed with a laparoscopic left inguinal hernia repair with mesh. I discussed this procedure in detail again. I discussed the risks which includes has not been to bleeding, infection, injury to surrounding structures, nerve entrapment, chronic pain, cardiopulmonary issues, postoperative recovery, etc. He understands and wished to proceed with surgery which will be scheduled. This patient encounter took 30 minutes today to perform the following: take history, perform exam, review outside records, interpret imaging, counsel the patient on their diagnosis and document encounter, findings & plan in the EHR

## 2019-06-27 ENCOUNTER — Ambulatory Visit (HOSPITAL_BASED_OUTPATIENT_CLINIC_OR_DEPARTMENT_OTHER)
Admission: RE | Admit: 2019-06-27 | Discharge: 2019-06-27 | Disposition: A | Discharge status: Home / Self Care | Payer: Commercial Managed Care - PPO | Attending: Surgery | Admitting: Surgery

## 2019-06-27 ENCOUNTER — Ambulatory Visit (HOSPITAL_BASED_OUTPATIENT_CLINIC_OR_DEPARTMENT_OTHER): Payer: Commercial Managed Care - PPO | Admitting: Anesthesiology

## 2019-06-27 ENCOUNTER — Encounter (HOSPITAL_BASED_OUTPATIENT_CLINIC_OR_DEPARTMENT_OTHER): Payer: Self-pay | Admitting: Surgery

## 2019-06-27 ENCOUNTER — Other Ambulatory Visit: Payer: Self-pay

## 2019-06-27 ENCOUNTER — Encounter (HOSPITAL_BASED_OUTPATIENT_CLINIC_OR_DEPARTMENT_OTHER)
Admission: RE | Disposition: A | Discharge status: Home / Self Care | Payer: Self-pay | Source: Home / Self Care | Attending: Surgery

## 2019-06-27 DIAGNOSIS — Z87891 Personal history of nicotine dependence: Secondary | ICD-10-CM | POA: Insufficient documentation

## 2019-06-27 DIAGNOSIS — F909 Attention-deficit hyperactivity disorder, unspecified type: Secondary | ICD-10-CM | POA: Diagnosis not present

## 2019-06-27 DIAGNOSIS — F419 Anxiety disorder, unspecified: Secondary | ICD-10-CM | POA: Diagnosis not present

## 2019-06-27 DIAGNOSIS — Z79899 Other long term (current) drug therapy: Secondary | ICD-10-CM | POA: Insufficient documentation

## 2019-06-27 DIAGNOSIS — K4090 Unilateral inguinal hernia, without obstruction or gangrene, not specified as recurrent: Secondary | ICD-10-CM | POA: Insufficient documentation

## 2019-06-27 HISTORY — PX: INGUINAL HERNIA REPAIR: SHX194

## 2019-06-27 SURGERY — REPAIR, HERNIA, INGUINAL, LAPAROSCOPIC
Anesthesia: General | Site: Abdomen | Laterality: Left

## 2019-06-27 MED ORDER — MIDAZOLAM HCL 2 MG/2ML IJ SOLN
1.0000 mg | INTRAMUSCULAR | Status: DC | PRN
  Administered 2019-06-27: 2 mg via INTRAVENOUS

## 2019-06-27 MED ORDER — BUPIVACAINE HCL (PF) 0.5 % IJ SOLN
INTRAMUSCULAR | Status: DC | PRN
  Administered 2019-06-27: 20 mL

## 2019-06-27 MED ORDER — ONDANSETRON HCL 4 MG/2ML IJ SOLN
INTRAMUSCULAR | Status: DC | PRN
  Administered 2019-06-27: 4 mg via INTRAVENOUS

## 2019-06-27 MED ORDER — MIDAZOLAM HCL 2 MG/2ML IJ SOLN
INTRAMUSCULAR | Status: AC
  Filled 2019-06-27: qty 2

## 2019-06-27 MED ORDER — CEFAZOLIN SODIUM-DEXTROSE 2-4 GM/100ML-% IV SOLN
INTRAVENOUS | Status: AC
  Filled 2019-06-27: qty 100

## 2019-06-27 MED ORDER — BUPIVACAINE HCL (PF) 0.5 % IJ SOLN
INTRAMUSCULAR | Status: AC
  Filled 2019-06-27: qty 30

## 2019-06-27 MED ORDER — CELECOXIB 200 MG PO CAPS
ORAL_CAPSULE | ORAL | Status: AC
  Filled 2019-06-27: qty 2

## 2019-06-27 MED ORDER — GABAPENTIN 300 MG PO CAPS
300.0000 mg | ORAL_CAPSULE | ORAL | Status: AC
  Administered 2019-06-27: 300 mg via ORAL

## 2019-06-27 MED ORDER — LACTATED RINGERS IV SOLN: INTRAVENOUS | Status: DC

## 2019-06-27 MED ORDER — ACETAMINOPHEN 500 MG PO TABS
1000.0000 mg | ORAL_TABLET | ORAL | Status: AC
  Administered 2019-06-27: 1000 mg via ORAL

## 2019-06-27 MED ORDER — FENTANYL CITRATE (PF) 100 MCG/2ML IJ SOLN
50.0000 ug | INTRAMUSCULAR | Status: AC | PRN
  Administered 2019-06-27: 50 ug via INTRAVENOUS
  Administered 2019-06-27 (×2): 25 ug via INTRAVENOUS
  Administered 2019-06-27: 100 ug via INTRAVENOUS

## 2019-06-27 MED ORDER — OXYCODONE HCL 5 MG PO TABS: 5.0000 mg | ORAL_TABLET | Freq: Once | ORAL | Status: DC | PRN

## 2019-06-27 MED ORDER — CHLORHEXIDINE GLUCONATE CLOTH 2 % EX PADS: 6.0000 | MEDICATED_PAD | Freq: Once | CUTANEOUS | Status: DC

## 2019-06-27 MED ORDER — CEFAZOLIN SODIUM-DEXTROSE 2-4 GM/100ML-% IV SOLN
2.0000 g | INTRAVENOUS | Status: AC
  Administered 2019-06-27: 2 g via INTRAVENOUS

## 2019-06-27 MED ORDER — OXYCODONE HCL 5 MG PO TABS: 5.0000 mg | ORAL_TABLET | Freq: Four times a day (QID) | ORAL | 0 refills | Status: DC | PRN

## 2019-06-27 MED ORDER — ACETAMINOPHEN 500 MG PO TABS
ORAL_TABLET | ORAL | Status: AC
  Filled 2019-06-27: qty 2

## 2019-06-27 MED ORDER — FENTANYL CITRATE (PF) 100 MCG/2ML IJ SOLN
INTRAMUSCULAR | Status: AC
  Filled 2019-06-27: qty 2

## 2019-06-27 MED ORDER — FENTANYL CITRATE (PF) 100 MCG/2ML IJ SOLN: 25.0000 ug | INTRAMUSCULAR | Status: DC | PRN

## 2019-06-27 MED ORDER — ROCURONIUM BROMIDE 100 MG/10ML IV SOLN
INTRAVENOUS | Status: DC | PRN
  Administered 2019-06-27: 50 mg via INTRAVENOUS

## 2019-06-27 MED ORDER — OXYCODONE HCL 5 MG/5ML PO SOLN: 5.0000 mg | Freq: Once | ORAL | Status: DC | PRN

## 2019-06-27 MED ORDER — SUGAMMADEX SODIUM 200 MG/2ML IV SOLN
INTRAVENOUS | Status: DC | PRN
  Administered 2019-06-27: 200 mg via INTRAVENOUS

## 2019-06-27 MED ORDER — DEXAMETHASONE SODIUM PHOSPHATE 4 MG/ML IJ SOLN
INTRAMUSCULAR | Status: DC | PRN
  Administered 2019-06-27: 4 mg via INTRAVENOUS

## 2019-06-27 MED ORDER — CELECOXIB 200 MG PO CAPS
400.0000 mg | ORAL_CAPSULE | ORAL | Status: AC
  Administered 2019-06-27: 400 mg via ORAL

## 2019-06-27 MED ORDER — ONDANSETRON HCL 4 MG/2ML IJ SOLN: 4.0000 mg | Freq: Four times a day (QID) | INTRAMUSCULAR | Status: DC | PRN

## 2019-06-27 MED ORDER — PROPOFOL 10 MG/ML IV BOLUS
INTRAVENOUS | Status: DC | PRN
  Administered 2019-06-27: 200 mg via INTRAVENOUS

## 2019-06-27 MED ORDER — ESMOLOL HCL 100 MG/10ML IV SOLN
INTRAVENOUS | Status: AC
  Filled 2019-06-27: qty 10

## 2019-06-27 MED ORDER — GABAPENTIN 300 MG PO CAPS
ORAL_CAPSULE | ORAL | Status: AC
  Filled 2019-06-27: qty 1

## 2019-06-27 SURGICAL SUPPLY — 48 items
BLADE CLIPPER SURG (BLADE) ×3 IMPLANT
BLADE SURG 15 STRL LF DISP TIS (BLADE) ×2 IMPLANT
BLADE SURG 15 STRL SS (BLADE) ×3
CANISTER SUCT 1200ML W/VALVE (MISCELLANEOUS) IMPLANT
CHLORAPREP W/TINT 26 (MISCELLANEOUS) ×3 IMPLANT
COVER BACK TABLE 60X90IN (DRAPES) ×3 IMPLANT
COVER MAYO STAND STRL (DRAPES) IMPLANT
COVER WAND RF STERILE (DRAPES) IMPLANT
DECANTER SPIKE VIAL GLASS SM (MISCELLANEOUS) IMPLANT
DERMABOND ADVANCED (GAUZE/BANDAGES/DRESSINGS) ×1
DERMABOND ADVANCED .7 DNX12 (GAUZE/BANDAGES/DRESSINGS) ×2 IMPLANT
DEVICE SECURE STRAP 25 ABSORB (INSTRUMENTS) ×3 IMPLANT
DISSECT BALLN SPACEMKR + OVL (BALLOONS) ×3
DISSECTOR BALLN SPACEMKR + OVL (BALLOONS) ×2 IMPLANT
DRAIN PENROSE 1/2X12 LTX STRL (WOUND CARE) IMPLANT
DRAPE LAPAROTOMY 100X72 PEDS (DRAPES) IMPLANT
DRAPE UTILITY XL STRL (DRAPES) IMPLANT
ELECT REM PT RETURN 9FT ADLT (ELECTROSURGICAL) ×3
ELECTRODE REM PT RTRN 9FT ADLT (ELECTROSURGICAL) ×2 IMPLANT
GLOVE BIOGEL PI IND STRL 6.5 (GLOVE) ×2 IMPLANT
GLOVE BIOGEL PI INDICATOR 6.5 (GLOVE) ×1
GLOVE SURG SIGNA 7.5 PF LTX (GLOVE) ×3 IMPLANT
GOWN STRL REUS W/ TWL LRG LVL3 (GOWN DISPOSABLE) ×2 IMPLANT
GOWN STRL REUS W/ TWL XL LVL3 (GOWN DISPOSABLE) ×2 IMPLANT
GOWN STRL REUS W/TWL LRG LVL3 (GOWN DISPOSABLE) ×3
GOWN STRL REUS W/TWL XL LVL3 (GOWN DISPOSABLE) ×3
MESH 3DMAX 4X6 LT LRG (Mesh General) ×3 IMPLANT
NEEDLE HYPO 25X1 1.5 SAFETY (NEEDLE) ×3 IMPLANT
NS IRRIG 1000ML POUR BTL (IV SOLUTION) ×3 IMPLANT
PENCIL SMOKE EVACUATOR (MISCELLANEOUS) ×3 IMPLANT
SET BASIN DAY SURGERY F.S. (CUSTOM PROCEDURE TRAY) ×3 IMPLANT
SET TROCAR LAP APPLE-HUNT 5MM (ENDOMECHANICALS) ×3 IMPLANT
SET TUBE SMOKE EVAC HIGH FLOW (TUBING) ×3 IMPLANT
SLEEVE SCD COMPRESS KNEE MED (MISCELLANEOUS) ×3 IMPLANT
SPONGE INTESTINAL PEANUT (DISPOSABLE) IMPLANT
SPONGE LAP 4X18 RFD (DISPOSABLE) IMPLANT
SUT MNCRL AB 4-0 PS2 18 (SUTURE) ×3 IMPLANT
SUT SILK 2 0 SH (SUTURE) IMPLANT
SUT VIC AB 2-0 CT1 27 (SUTURE)
SUT VIC AB 2-0 CT1 TAPERPNT 27 (SUTURE) IMPLANT
SUT VIC AB 3-0 CT1 27 (SUTURE)
SUT VIC AB 3-0 CT1 27XBRD (SUTURE) IMPLANT
SYR BULB EAR ULCER 3OZ GRN STR (SYRINGE) IMPLANT
SYR CONTROL 10ML LL (SYRINGE) ×3 IMPLANT
TOWEL GREEN STERILE FF (TOWEL DISPOSABLE) ×3 IMPLANT
TRAY LAPAROSCOPIC (CUSTOM PROCEDURE TRAY) ×3 IMPLANT
TUBE CONNECTING 20X1/4 (TUBING) IMPLANT
YANKAUER SUCT BULB TIP NO VENT (SUCTIONS) IMPLANT

## 2019-06-27 NOTE — Anesthesia Procedure Notes (Signed)
Procedure Name: Intubation Date/Time: 06/27/2019 11:41 AM Performed by: Signe Colt, CRNA Pre-anesthesia Checklist: Patient identified, Emergency Drugs available, Suction available and Patient being monitored Patient Re-evaluated:Patient Re-evaluated prior to induction Oxygen Delivery Method: Circle system utilized Preoxygenation: Pre-oxygenation with 100% oxygen Induction Type: IV induction Ventilation: Mask ventilation without difficulty Laryngoscope Size: Mac and 3 Grade View: Grade I Tube type: Oral Tube size: 7.0 mm Number of attempts: 1 Airway Equipment and Method: Stylet and Oral airway Placement Confirmation: ETT inserted through vocal cords under direct vision,  positive ETCO2 and breath sounds checked- equal and bilateral Secured at: 21 cm Tube secured with: Tape Dental Injury: Teeth and Oropharynx as per pre-operative assessment

## 2019-06-27 NOTE — Interval H&P Note (Signed)
History and Physical Interval Note: no change in H and P  06/27/2019 10:54 AM  Jeffrey English  has presented today for surgery, with the diagnosis of LEFT INGUINAL HERNIA.  The various methods of treatment have been discussed with the patient and family. After consideration of risks, benefits and other options for treatment, the patient has consented to  Procedure(s): LAPAROSCOPIC LEFT INGUINAL HERNIA REPAIR WITH MESH (Left) as a surgical intervention.  The patient's history has been reviewed, patient examined, no change in status, stable for surgery.  I have reviewed the patient's chart and labs.  Questions were answered to the patient's satisfaction.     [name]

## 2019-06-27 NOTE — Op Note (Signed)
LAPAROSCOPIC LEFT INGUINAL HERNIA REPAIR WITH MESH  Procedure Note  Jeffrey English 06/27/2019   Pre-op Diagnosis: LEFT INGUINAL HERNIA     Post-op Diagnosis: same  Procedure(s): LAPAROSCOPIC LEFT INGUINAL HERNIA REPAIR WITH MESH  Surgeon(s): [name], MD  Anesthesia: General  Staff:  Circulator: Pablo Ledger, RN Relief Circulator: Lenn Cal, RN Scrub Person: Carma Lair D  Estimated Blood Loss: Minimal               Findings: The patient was found to have an indirect left inguinal hernia without evidence of right inguinal hernia.  The hernia was repaired with a piece of large Prolene 3D max mesh from [employer]  Procedure: The patient was brought to the operating room identifies correct patient.  He is placed upon the operating table general anesthesia was induced.  His abdomen was then prepped and draped in usual sterile fashion.  I made a small vertical incision below the umbilicus with a scalpel.  I carried this down to the fascia which was opened just to the right of the midline.  The rectus muscle was then elevated.  I then passed the dissecting balloon underneath the rectus muscle and manipulated toward the pubis.  I then insufflated the dissecting balloon under direct vision dissecting out the preperitoneal space.  The dissecting balloon was then removed and insufflation was begun with carbon dioxide.  I then made 2 separate incisions in the patient's lower midline with a scalpel and placed two 5 mm trochars under direct vision into the preperitoneal space.  The patient had a obvious large indirect hernia sac on the left.  I evaluated the right side and saw no evidence of direct or indirect hernia.  I then dissected out the testicular cord and structures and was able to reduce the very thickened sac from the cord structures.  I then identified Cooper's ligament.  I saw no evidence of direct hernia.  Next a piece of [employer] 3D max Prolene mesh was brought to the field.   I used a large piece of mesh.  I placed it through the trocar the umbilicus and then opened as an only on the left inguinal floor under direct vision.  I then tacked the mesh with the absorbable tacker to Cooper's ligament, of the medial abdominal wall, and slightly laterally.  Wide coverage of the inguinal floor and ring appeared to be achieved.  Hemostasis appeared to be achieved.  I then removed the trochars and deflated the preperitoneal space watching the mesh lay appropriately as the space collapsed.  I then removed the trocar the umbilicus.  I released all the air.  I then closed the fascia with a figure-of-eight 0 Vicryl suture.  All incisions were then anesthetized Marcaine.  I performed a left ilioinguinal nerve block with Marcaine as well.  I then closed the incisions with 4-0 Monocryl subcuticular sutures and Dermabond.  The patient tolerated procedure well.  All the counts were correct at the end of the procedure.  The patient was then extubated in the operating room and taken in a stable addition to the recovery room.          [name]   Date: 06/27/2019  Time: 12:30 PM

## 2019-06-27 NOTE — Anesthesia Preprocedure Evaluation (Signed)
Anesthesia Evaluation  Patient identified by MRN, date of birth, ID band Patient awake    Reviewed: Allergy & Precautions, H&P , NPO status , Patient's Chart, lab work & pertinent test results  Airway Mallampati: II   Neck ROM: full    Dental   Pulmonary former smoker,    breath sounds clear to auscultation       Cardiovascular negative cardio ROS   Rhythm:regular Rate:Normal     Neuro/Psych PSYCHIATRIC DISORDERS Anxiety ADHD   GI/Hepatic   Endo/Other    Renal/GU      Musculoskeletal   Abdominal   Peds  Hematology   Anesthesia Other Findings   Reproductive/Obstetrics                             Anesthesia Physical Anesthesia Plan  ASA: II  Anesthesia Plan: General   Post-op Pain Management:    Induction: Intravenous  PONV Risk Score and Plan: 2 and Ondansetron, Dexamethasone, Midazolam and Treatment may vary due to age or medical condition  Airway Management Planned: Oral ETT  Additional Equipment:   Intra-op Plan:   Post-operative Plan: Extubation in OR  Informed Consent: I have reviewed the patients History and Physical, chart, labs and discussed the procedure including the risks, benefits and alternatives for the proposed anesthesia with the patient or authorized representative who has indicated his/her understanding and acceptance.       Plan Discussed with: CRNA, Anesthesiologist and Surgeon  Anesthesia Plan Comments:         Anesthesia Quick Evaluation

## 2019-06-27 NOTE — Transfer of Care (Signed)
Immediate Anesthesia Transfer of Care Note  Patient: Cephus Tupy  Procedure(s) Performed: LAPAROSCOPIC LEFT INGUINAL HERNIA REPAIR WITH MESH (Left Abdomen)  Patient Location: PACU  Anesthesia Type:General  Level of Consciousness: awake, alert , oriented and patient cooperative  Airway & Oxygen Therapy: Patient Spontanous Breathing and Patient connected to face mask oxygen  Post-op Assessment: Report given to RN and Post -op Vital signs reviewed and stable  Post vital signs: Reviewed and stable  Last Vitals:  Vitals Value Taken Time  BP    Temp    Pulse 70 06/27/19 1235  Resp 14 06/27/19 1235  SpO2 100 % 06/27/19 1235  Vitals shown include unvalidated device data.  Last Pain:  Vitals:   06/27/19 0903  PainSc: 0-No pain         Complications: No apparent anesthesia complications

## 2019-06-27 NOTE — Discharge Instructions (Signed)
[facility] _______[facility]  UMBILICAL OR INGUINAL HERNIA REPAIR: POST OP INSTRUCTIONS  Always review your discharge instruction sheet given to you by the facility where your surgery was performed. IF YOU HAVE DISABILITY OR FAMILY LEAVE FORMS, YOU MUST BRING THEM TO THE OFFICE FOR PROCESSING.   DO NOT GIVE THEM TO YOUR DOCTOR.  1. A  prescription for pain medication may be given to you upon discharge.  Take your pain medication as prescribed, if needed.  If narcotic pain medicine is not needed, then you may take acetaminophen (Tylenol) or ibuprofen (Advil) as needed. 2. Take your usually prescribed medications unless otherwise directed. If you need a refill on your pain medication, please contact your pharmacy.  They will contact our office to request authorization. Prescriptions will not be filled after 5 pm or on week-ends. 3. You should follow a light diet the first 24 hours after arrival home, such as soup and crackers, etc.  Be sure to include lots of fluids daily.  Resume your normal diet the day after surgery. 4.Most patients will experience some swelling and bruising around the umbilicus or in the groin and scrotum.  Ice packs and reclining will help.  Swelling and bruising can take several days to resolve.  6. It is common to experience some constipation if taking pain medication after surgery.  Increasing fluid intake and taking a stool softener (such as Colace) will usually help or prevent this problem from occurring.  A mild laxative (Milk of Magnesia or Miralax) should be taken according to package directions if there are no bowel movements after 48 hours. 7. Unless discharge instructions indicate otherwise, you may remove your bandages 24-48 hours after surgery, and you may shower at that time.  You may have steri-strips (small skin tapes) in place directly over the incision.  These strips should be left on the skin for 7-10 days.  If your surgeon used skin glue on the  incision, you may shower in 24 hours.  The glue will flake off over the next 2-3 weeks.  Any sutures or staples will be removed at the office during your follow-up visit. 8. ACTIVITIES:  You may resume regular (light) daily activities beginning the next day--such as daily self-care, walking, climbing stairs--gradually increasing activities as tolerated.  You may have sexual intercourse when it is comfortable.  Refrain from any heavy lifting or straining until approved by your doctor.  a.You may drive when you are no longer taking prescription pain medication, you can comfortably wear a seatbelt, and you can safely maneuver your car and apply brakes. b.RETURN TO WORK:   _____________________________________________  9.You should see your doctor in the office for a follow-up appointment approximately 2-3 weeks after your surgery.  Make sure that you call for this appointment within a day or two after you arrive home to insure a convenient appointment time. 10.OTHER INSTRUCTIONS: _OK TO SHOWER STARTING TOMORROW ICE PACK, TYLENOL, AND IBUPROFEN ALSO FOR PAN NO LIFTING MORE THAN 15 TO 20 POUNDS FOR 3 WEEKS________________________    _____________________________________  WHEN TO CALL YOUR DOCTOR: 1. Fever over 101.0 2. Inability to urinate 3. Nausea and/or vomiting 4. Extreme swelling or bruising 5. Continued bleeding from incision. 6. Increased pain, redness, or drainage from the incision  The clinic staff is available to answer your questions during regular business hours.  Please don't hesitate to call and ask to speak to one of the nurses for clinical concerns.  If you have a medical emergency, go to  the nearest emergency room or call 911.  A surgeon from [facility] is always on call at the hospital   Post Anesthesia Home Care Instructions  Activity: Get plenty of rest for the remainder of the day. A responsible individual must stay with you for 24 hours following the  procedure.  For the next 24 hours, DO NOT: -Drive a car -Paediatric nurse -Drink alcoholic beverages -Take any medication unless instructed by your physician -Make any legal decisions or sign important papers.  Meals: Start with liquid foods such as gelatin or soup. Progress to regular foods as tolerated. Avoid greasy, spicy, heavy foods. If nausea and/or vomiting occur, drink only clear liquids until the nausea and/or vomiting subsides. Call your physician if vomiting continues.  Special Instructions/Symptoms: Your throat may feel dry or sore from the anesthesia or the breathing tube placed in your throat during surgery. If this causes discomfort, gargle with warm salt water. The discomfort should disappear within 24 hours.  If you had a scopolamine patch placed behind your ear for the management of post- operative nausea and/or vomiting:  1. The medication in the patch is effective for 72 hours, after which it should be removed.  Wrap patch in a tissue and discard in the trash. Wash hands thoroughly with soap and water. 2. You may remove the patch earlier than 72 hours if you experience unpleasant side effects which may include dry mouth, dizziness or visual disturbances. 3. Avoid touching the patch. Wash your hands with soap and water after contact with the patch.    Next dose of Tylenol or Ibuprofen can be taken 3pm today as needed.     [address] [place], [place], [place]  [address] ?  [address], [place], [place]   [address] [phone] ? [phone] ? FAX [phone] Web site: [email or web address]

## 2019-06-28 NOTE — Anesthesia Postprocedure Evaluation (Signed)
Anesthesia Post Note  Patient: Electrical engineer  Procedure(English) Performed: LAPAROSCOPIC LEFT INGUINAL HERNIA REPAIR WITH MESH (Left Abdomen)     Patient location during evaluation: PACU Anesthesia Type: General Level of consciousness: awake and alert Pain management: pain level controlled Vital Signs Assessment: post-procedure vital signs reviewed and stable Respiratory status: spontaneous breathing, nonlabored ventilation, respiratory function stable and patient connected to nasal cannula oxygen Cardiovascular status: blood pressure returned to baseline and stable Postop Assessment: no apparent nausea or vomiting Anesthetic complications: no    Last Vitals:  Vitals:   06/27/19 1234 06/27/19 1245  BP: (!) 148/92 (!) 148/96  Pulse: 71 68  Resp: 15 16  Temp: (!) 36.3 C   SpO2: 100% 97%    Last Pain:  Vitals:   06/28/19 0954  PainSc: 1    Pain Goal:                   Jeffrey English

## 2019-06-29 ENCOUNTER — Encounter: Payer: Self-pay | Admitting: *Deleted

## 2019-12-20 ENCOUNTER — Ambulatory Visit (INDEPENDENT_AMBULATORY_CARE_PROVIDER_SITE_OTHER): Payer: Commercial Managed Care - PPO | Admitting: Otolaryngology

## 2020-01-03 ENCOUNTER — Other Ambulatory Visit: Payer: Self-pay | Admitting: Otolaryngology

## 2020-01-04 ENCOUNTER — Other Ambulatory Visit: Payer: Self-pay

## 2020-01-04 ENCOUNTER — Encounter (HOSPITAL_BASED_OUTPATIENT_CLINIC_OR_DEPARTMENT_OTHER): Payer: Self-pay | Admitting: Otolaryngology

## 2020-01-12 ENCOUNTER — Other Ambulatory Visit: Payer: Self-pay | Admitting: Otolaryngology

## 2020-01-12 ENCOUNTER — Encounter (HOSPITAL_BASED_OUTPATIENT_CLINIC_OR_DEPARTMENT_OTHER)
Admission: RE | Admit: 2020-01-12 | Discharge: 2020-01-12 | Disposition: A | Discharge status: Home / Self Care | Payer: Commercial Managed Care - PPO | Source: Ambulatory Visit | Attending: Otolaryngology | Admitting: Otolaryngology

## 2020-01-12 ENCOUNTER — Inpatient Hospital Stay (HOSPITAL_COMMUNITY): Admission: RE | Admit: 2020-01-12 | Payer: Commercial Managed Care - PPO | Source: Ambulatory Visit

## 2020-01-12 DIAGNOSIS — Z87891 Personal history of nicotine dependence: Secondary | ICD-10-CM | POA: Diagnosis not present

## 2020-01-12 DIAGNOSIS — J988 Other specified respiratory disorders: Secondary | ICD-10-CM | POA: Diagnosis not present

## 2020-01-12 DIAGNOSIS — J343 Hypertrophy of nasal turbinates: Secondary | ICD-10-CM | POA: Diagnosis not present

## 2020-01-12 DIAGNOSIS — J342 Deviated nasal septum: Secondary | ICD-10-CM | POA: Diagnosis present

## 2020-01-12 DIAGNOSIS — Z79899 Other long term (current) drug therapy: Secondary | ICD-10-CM | POA: Diagnosis not present

## 2020-01-13 ENCOUNTER — Other Ambulatory Visit (HOSPITAL_COMMUNITY)
Admission: RE | Admit: 2020-01-13 | Discharge: 2020-01-13 | Disposition: A | Discharge status: Home / Self Care | Payer: Commercial Managed Care - PPO | Source: Ambulatory Visit | Attending: Otolaryngology | Admitting: Otolaryngology

## 2020-01-13 DIAGNOSIS — Z01812 Encounter for preprocedural laboratory examination: Secondary | ICD-10-CM | POA: Diagnosis present

## 2020-01-13 DIAGNOSIS — Z20822 Contact with and (suspected) exposure to covid-19: Secondary | ICD-10-CM | POA: Diagnosis not present

## 2020-01-13 LAB — SARS CORONAVIRUS 2 (TAT 6-24 HRS): SARS Coronavirus 2: NEGATIVE

## 2020-01-16 ENCOUNTER — Encounter (HOSPITAL_BASED_OUTPATIENT_CLINIC_OR_DEPARTMENT_OTHER)
Admission: RE | Disposition: A | Discharge status: Home / Self Care | Payer: Self-pay | Source: Home / Self Care | Attending: Otolaryngology

## 2020-01-16 ENCOUNTER — Ambulatory Visit (HOSPITAL_BASED_OUTPATIENT_CLINIC_OR_DEPARTMENT_OTHER)
Admission: RE | Admit: 2020-01-16 | Discharge: 2020-01-16 | Disposition: A | Discharge status: Home / Self Care | Payer: Commercial Managed Care - PPO | Attending: Otolaryngology | Admitting: Otolaryngology

## 2020-01-16 ENCOUNTER — Other Ambulatory Visit: Payer: Self-pay

## 2020-01-16 ENCOUNTER — Ambulatory Visit (HOSPITAL_BASED_OUTPATIENT_CLINIC_OR_DEPARTMENT_OTHER): Payer: Commercial Managed Care - PPO | Admitting: Anesthesiology

## 2020-01-16 ENCOUNTER — Encounter (HOSPITAL_BASED_OUTPATIENT_CLINIC_OR_DEPARTMENT_OTHER): Payer: Self-pay | Admitting: Otolaryngology

## 2020-01-16 DIAGNOSIS — J988 Other specified respiratory disorders: Secondary | ICD-10-CM | POA: Insufficient documentation

## 2020-01-16 DIAGNOSIS — Z79899 Other long term (current) drug therapy: Secondary | ICD-10-CM | POA: Insufficient documentation

## 2020-01-16 DIAGNOSIS — J342 Deviated nasal septum: Secondary | ICD-10-CM

## 2020-01-16 DIAGNOSIS — Z87891 Personal history of nicotine dependence: Secondary | ICD-10-CM | POA: Insufficient documentation

## 2020-01-16 DIAGNOSIS — J343 Hypertrophy of nasal turbinates: Secondary | ICD-10-CM | POA: Insufficient documentation

## 2020-01-16 HISTORY — DX: Essential (primary) hypertension: I10

## 2020-01-16 HISTORY — PX: NASAL SEPTOPLASTY W/ TURBINOPLASTY: SHX2070

## 2020-01-16 SURGERY — SEPTOPLASTY, NOSE, WITH NASAL TURBINATE REDUCTION
Anesthesia: General | Site: Nose | Laterality: Bilateral

## 2020-01-16 MED ORDER — LIDOCAINE 2% (20 MG/ML) 5 ML SYRINGE
INTRAMUSCULAR | Status: AC
  Filled 2020-01-16: qty 5

## 2020-01-16 MED ORDER — SUGAMMADEX SODIUM 500 MG/5ML IV SOLN
INTRAVENOUS | Status: AC
  Filled 2020-01-16: qty 5

## 2020-01-16 MED ORDER — DEXAMETHASONE SODIUM PHOSPHATE 4 MG/ML IJ SOLN
INTRAMUSCULAR | Status: DC | PRN
  Administered 2020-01-16: 10 mg via INTRAVENOUS

## 2020-01-16 MED ORDER — CEPHALEXIN 500 MG PO CAPS: 500.0000 mg | ORAL_CAPSULE | Freq: Three times a day (TID) | ORAL | 0 refills | Status: AC

## 2020-01-16 MED ORDER — ROCURONIUM BROMIDE 100 MG/10ML IV SOLN
INTRAVENOUS | Status: DC | PRN
  Administered 2020-01-16: 60 mg via INTRAVENOUS

## 2020-01-16 MED ORDER — SUGAMMADEX SODIUM 500 MG/5ML IV SOLN
INTRAVENOUS | Status: DC | PRN
  Administered 2020-01-16: 400 mg via INTRAVENOUS

## 2020-01-16 MED ORDER — FENTANYL CITRATE (PF) 100 MCG/2ML IJ SOLN
INTRAMUSCULAR | Status: DC | PRN
Start: 2020-01-16 — End: 2020-01-16
  Administered 2020-01-16: 100 ug via INTRAVENOUS

## 2020-01-16 MED ORDER — ONDANSETRON HCL 4 MG/2ML IJ SOLN: 4.0000 mg | Freq: Once | INTRAMUSCULAR | Status: DC | PRN

## 2020-01-16 MED ORDER — OXYCODONE HCL 5 MG/5ML PO SOLN: 5.0000 mg | Freq: Once | ORAL | Status: DC | PRN

## 2020-01-16 MED ORDER — LIDOCAINE HCL (CARDIAC) PF 100 MG/5ML IV SOSY
PREFILLED_SYRINGE | INTRAVENOUS | Status: DC | PRN
  Administered 2020-01-16: 100 mg via INTRAVENOUS

## 2020-01-16 MED ORDER — LIDOCAINE-EPINEPHRINE 1 %-1:100000 IJ SOLN
INTRAMUSCULAR | Status: DC | PRN
  Administered 2020-01-16: 6 mL

## 2020-01-16 MED ORDER — OXYMETAZOLINE HCL 0.05 % NA SOLN
NASAL | Status: DC | PRN
  Administered 2020-01-16: 1

## 2020-01-16 MED ORDER — PROPOFOL 10 MG/ML IV BOLUS
INTRAVENOUS | Status: AC
  Filled 2020-01-16: qty 20

## 2020-01-16 MED ORDER — ROCURONIUM BROMIDE 10 MG/ML (PF) SYRINGE
PREFILLED_SYRINGE | INTRAVENOUS | Status: AC
  Filled 2020-01-16: qty 10

## 2020-01-16 MED ORDER — AMISULPRIDE (ANTIEMETIC) 5 MG/2ML IV SOLN: 10.0000 mg | Freq: Once | INTRAVENOUS | Status: DC | PRN

## 2020-01-16 MED ORDER — LACTATED RINGERS IV SOLN: INTRAVENOUS | Status: DC

## 2020-01-16 MED ORDER — CEFAZOLIN SODIUM-DEXTROSE 2-4 GM/100ML-% IV SOLN
INTRAVENOUS | Status: AC
  Filled 2020-01-16: qty 100

## 2020-01-16 MED ORDER — CEFAZOLIN SODIUM-DEXTROSE 2-4 GM/100ML-% IV SOLN: 2.0000 g | Freq: Once | INTRAVENOUS | Status: DC

## 2020-01-16 MED ORDER — MIDAZOLAM HCL 2 MG/2ML IJ SOLN
INTRAMUSCULAR | Status: AC
  Filled 2020-01-16: qty 2

## 2020-01-16 MED ORDER — MUPIROCIN 2 % EX OINT
TOPICAL_OINTMENT | CUTANEOUS | Status: DC | PRN
  Administered 2020-01-16: 1 via NASAL

## 2020-01-16 MED ORDER — PROPOFOL 10 MG/ML IV BOLUS
INTRAVENOUS | Status: DC | PRN
  Administered 2020-01-16: 200 mg via INTRAVENOUS

## 2020-01-16 MED ORDER — ONDANSETRON HCL 4 MG/2ML IJ SOLN
INTRAMUSCULAR | Status: AC
  Filled 2020-01-16: qty 2

## 2020-01-16 MED ORDER — ONDANSETRON HCL 4 MG/2ML IJ SOLN
INTRAMUSCULAR | Status: DC | PRN
  Administered 2020-01-16: 4 mg via INTRAVENOUS

## 2020-01-16 MED ORDER — CEFAZOLIN SODIUM-DEXTROSE 2-3 GM-%(50ML) IV SOLR
INTRAVENOUS | Status: DC | PRN
  Administered 2020-01-16: 2 g via INTRAVENOUS

## 2020-01-16 MED ORDER — FENTANYL CITRATE (PF) 100 MCG/2ML IJ SOLN: 25.0000 ug | INTRAMUSCULAR | Status: DC | PRN

## 2020-01-16 MED ORDER — MIDAZOLAM HCL 5 MG/5ML IJ SOLN
INTRAMUSCULAR | Status: DC | PRN
  Administered 2020-01-16: 2 mg via INTRAVENOUS

## 2020-01-16 MED ORDER — DEXAMETHASONE SODIUM PHOSPHATE 10 MG/ML IJ SOLN
INTRAMUSCULAR | Status: AC
  Filled 2020-01-16: qty 1

## 2020-01-16 MED ORDER — OXYCODONE HCL 5 MG PO TABS: 5.0000 mg | ORAL_TABLET | Freq: Once | ORAL | Status: DC | PRN

## 2020-01-16 MED ORDER — FENTANYL CITRATE (PF) 100 MCG/2ML IJ SOLN
INTRAMUSCULAR | Status: AC
  Filled 2020-01-16: qty 2

## 2020-01-16 SURGICAL SUPPLY — 25 items
ATTRACTOMAT 16X20 MAGNETIC DRP (DRAPES) ×2 IMPLANT
BLADE SURG 15 STRL LF DISP TIS (BLADE) ×1 IMPLANT
BLADE SURG 15 STRL SS (BLADE) ×2
CANISTER SUCT 1200ML W/VALVE (MISCELLANEOUS) ×2 IMPLANT
COAGULATOR SUCT 8FR VV (MISCELLANEOUS) ×2 IMPLANT
ELECT REM PT RETURN 9FT ADLT (ELECTROSURGICAL) ×2
ELECTRODE REM PT RTRN 9FT ADLT (ELECTROSURGICAL) ×1 IMPLANT
GLOVE BIO SURGEON STRL SZ 6.5 (GLOVE) ×2 IMPLANT
GLOVE BIOGEL M 7.0 STRL (GLOVE) ×4 IMPLANT
GOWN STRL REUS W/ TWL LRG LVL3 (GOWN DISPOSABLE) ×2 IMPLANT
GOWN STRL REUS W/TWL LRG LVL3 (GOWN DISPOSABLE) ×4
IV SET EXT 30 76VOL 4 MALE LL (IV SETS) ×2 IMPLANT
NEEDLE PRECISIONGLIDE 27X1.5 (NEEDLE) ×2 IMPLANT
NS IRRIG 1000ML POUR BTL (IV SOLUTION) ×2 IMPLANT
PACK BASIN DAY SURGERY FS (CUSTOM PROCEDURE TRAY) ×2 IMPLANT
PACK ENT DAY SURGERY (CUSTOM PROCEDURE TRAY) ×2 IMPLANT
SLEEVE SCD COMPRESS KNEE MED (MISCELLANEOUS) ×2 IMPLANT
SPLINT NASAL AIRWAY SILICONE (MISCELLANEOUS) ×2 IMPLANT
SPONGE GAUZE 2X2 8PLY STRL LF (GAUZE/BANDAGES/DRESSINGS) ×2 IMPLANT
SPONGE NEURO XRAY DETECT 1X3 (DISPOSABLE) ×2 IMPLANT
SUT ETHILON 3 0 PS 1 (SUTURE) ×2 IMPLANT
SUT PLAIN 4 0 ~~LOC~~ 1 (SUTURE) ×2 IMPLANT
TOWEL GREEN STERILE FF (TOWEL DISPOSABLE) ×2 IMPLANT
TUBE SALEM SUMP 16 FR W/ARV (TUBING) ×2 IMPLANT
YANKAUER SUCT BULB TIP NO VENT (SUCTIONS) ×2 IMPLANT

## 2020-01-16 NOTE — Transfer of Care (Signed)
Immediate Anesthesia Transfer of Care Note  Patient: Jeffrey English  Procedure(s) Performed: NASAL SEPTOPLASTY WITH TURBINATE REDUCTION (Bilateral Nose)  Patient Location: PACU  Anesthesia Type:General  Level of Consciousness: awake, alert  and oriented  Airway & Oxygen Therapy: Patient Spontanous Breathing and Patient connected to face mask oxygen  Post-op Assessment: Report given to RN and Post -op Vital signs reviewed and stable  Post vital signs: Reviewed and stable  Last Vitals:  Vitals Value Taken Time  BP    Temp    Pulse 76 01/16/20 1009  Resp    SpO2 100 % 01/16/20 1009  Vitals shown include unvalidated device data.  Last Pain:  Vitals:   01/16/20 0820  TempSrc: Oral  PainSc: 0-No pain         Complications: No complications documented.

## 2020-01-16 NOTE — Op Note (Signed)
Operative Note: SEPTOPLASTY AND INFERIOR TURBINATE REDUCTION  Patient: Jeffrey English  Medical record number: [id number]  Date:01/16/2020  Pre-operative Indications: 1. Deviated nasal septum with nasal airway obstruction     2.  Bilateral inferior turbinate hypertrophy  Postoperative Indications: Same  Surgical Procedure: 1.  Nasal Septoplasty    2.  Bilateral Inferior Turbinate Reduction  Anesthesia: GET  Surgeon: Barbee Cough, M.D.  Complications: None  EBL: 50 cc  Findings: Severely deviated nasal septum with airway obstruction and bilateral inferior turbinate hypertrophy.   Brief History: The patient is a 39 y.o. male with a history of progressive nasal airway obstruction. The patient has been on medical therapy to reduce nasal mucosal edema including saline nasal spray and topical nasal steroids. Despite appropriate medical therapy the patient continues to have ongoing symptoms. Given the patient's history and findings, the above surgical procedures were recommended, risks and benefits were discussed in detail with the patient may understand and agree with our plan for surgery which is scheduled at [facility] under general anesthesia as an outpatient.  Surgical Procedure: The patient is brought to the operating room on 01/16/2020 and placed in supine position on the operating table. General endotracheal anesthesia was established without difficulty. When the patient was adequately anesthetized, surgical timeout was performed and correct identification of the patient and the surgical procedure. The patient's nose was then injected with  5 cc of 1% lidocaine 1 100,000 dilution epinephrine which was injected in a submucosal fashion. The patient's nose was then packed with Afrin-soaked cottonoid pledgets were left in place for approximately 10 minutes lateral vasoconstriction and hemostasis. .  With the patient prepped draped and prepared for surgery, nasal septoplasty was begun.  A left  anterior hemitransfixion incision was created and a mucoperichondrial flap was elevated from anterior to posterior on the left-hand side. The anterior cartilaginous septum was crossed at the midline and a mucoperichondrial flap was elevated on the patient's right.  Swivel knife was then used to resect the anterior and mid cartilaginous portion of the nasal septum.  Resected cartilage was morcellized and returned to the mucoperichondrial pocket at the occlusion of the surgical procedure.  Dissection was then carried out from anterior to posterior removing deviated bone and cartilage including a large septal spur the overlying mucosa was preserved.  With the septum brought to good midline position, the morselized cartilage was returned to the mucoperichondrial pocket and the soft tissue/mucosal flaps were reapproximated with interrupted 4-0 gut suture on a Keith needle in a horizontal mattressing fashion.  Anterior hemitransfixion incision was closed with the same stitch.  Bilateral Doyle nasal septal splints were then placed after the application of Bactroban ointment and sutured in position with a 3-0 Ethilon suture.  Attention was then turned to the inferior turbinates, bilateral inferior turbinate intramural cautery was performed with cautery setting at 12 W.  2 submucosal passes were made in each inferior turbinate.  After completing cautery, anterior vertical incisions were created and overlying soft tissue was elevated, a small amount of turbinate bone was resected.  The turbinates were then outfractured to create a more patent nasal passageway.  Surgical sponge count was correct. An oral gastric tube was passed and the stomach contents were aspirated. Patient was awakened from anesthetic and transferred from the operating room to the recovery room in stable condition. There were no complications and blood loss was 50cc.   Barbee Cough, M.D. [facility] 01/16/2020

## 2020-01-16 NOTE — Anesthesia Postprocedure Evaluation (Signed)
Anesthesia Post Note  Patient: Jeffrey English  Procedure(s) Performed: NASAL SEPTOPLASTY WITH TURBINATE REDUCTION (Bilateral Nose)     Patient location during evaluation: PACU Anesthesia Type: General Level of consciousness: awake and alert Pain management: pain level controlled Vital Signs Assessment: post-procedure vital signs reviewed and stable Respiratory status: spontaneous breathing, nonlabored ventilation and respiratory function stable Cardiovascular status: blood pressure returned to baseline and stable Postop Assessment: no apparent nausea or vomiting Anesthetic complications: no   No complications documented.  Last Vitals:  Vitals:   01/16/20 1020 01/16/20 1045  BP: (!) 139/96 (!) 133/97  Pulse: 72 70  Resp: 17 14  Temp:  (!) 36.2 C  SpO2: 100% 98%    Last Pain:  Vitals:   01/16/20 1032  TempSrc:   PainSc: 4                  Lucretia Kern

## 2020-01-16 NOTE — H&P (Signed)
Jeffrey English is an 39 y.o. male.   Chief Complaint: Nasal obstruction HPI: Progressive Nasal obstruction  Past Medical History:  Diagnosis Date  . ADHD   . Anxiety   . Hypertension     Past Surgical History:  Procedure Laterality Date  . INGUINAL HERNIA REPAIR Left 06/27/2019   Procedure: LAPAROSCOPIC LEFT INGUINAL HERNIA REPAIR WITH MESH;  Surgeon: [name], MD;  Location:  S[facility]ERY [facility];  Service: General;  Laterality: Left;    Family History  Problem Relation Age of Onset  . Cancer Paternal Uncle 32       lung cancer   Social History:  reports that he quit smoking about 11 years ago. He has never used smokeless tobacco. He reports current alcohol use. He reports that he does not use drugs.  Allergies: No Known Allergies  Medications Prior to Admission  Medication Sig Dispense Refill  . ALPRAZolam (XANAX) 1 MG tablet   0  . amphetamine-dextroamphetamine (ADDERALL XR) 15 MG 24 hr capsule Take 15 mg by mouth daily.    Marland Kitchen amphetamine-dextroamphetamine (ADDERALL) 10 MG tablet Take 10 mg by mouth daily with breakfast. Reported on 04/23/2015    . fluticasone (FLONASE) 50 MCG/ACT nasal spray instill 1 spray into each nostril EACH NIGHT  0  . lisinopril (ZESTRIL) 10 MG tablet Take 10 mg by mouth daily.    Marland Kitchen omeprazole (PRILOSEC) 40 MG capsule   0  . Diclofenac Sodium (PENNSAID) 2 % SOLN Apply 2 g topically 2 (two) times daily as needed (to affected area). 112 g 3    No results found for this or any previous visit (from the past 48 hour(s)). No results found.  Review of Systems  Constitutional: Negative.   HENT:       Nasal obstruction  Respiratory: Negative.   Cardiovascular: Negative.     Blood pressure (!) 146/99, pulse 71, temperature 98.2 F (36.8 C), temperature source Oral, resp. rate 16, height 6' (1.829 m), weight 101.4 kg, SpO2 100 %. Physical Exam Constitutional:      Appearance: He is normal weight.  HENT:     Nose:     Comments: Nasal  obstruction Cardiovascular:     Rate and Rhythm: Normal rate.  Pulmonary:     Effort: Pulmonary effort is normal.  Musculoskeletal:     Cervical back: Normal range of motion.  Neurological:     Mental Status: He is alert.      Assessment/Plan Adm for OP septoplasty and IT reduction  Osborn Coho, MD 01/16/2020, 8:29 AM

## 2020-01-16 NOTE — Anesthesia Preprocedure Evaluation (Addendum)
Anesthesia Evaluation  Patient identified by MRN, date of birth, ID band Patient awake    Reviewed: Allergy & Precautions, NPO status , Patient's Chart, lab work & pertinent test results  History of Anesthesia Complications Negative for: history of anesthetic complications  Airway Mallampati: II  TM Distance: >3 FB Neck ROM: Full    Dental  (+) Teeth Intact   Pulmonary neg pulmonary ROS, former smoker,    Pulmonary exam normal        Cardiovascular hypertension, Pt. on medications Normal cardiovascular exam     Neuro/Psych Anxiety negative neurological ROS     GI/Hepatic negative GI ROS, Neg liver ROS,   Endo/Other  negative endocrine ROS  Renal/GU negative Renal ROS  negative genitourinary   Musculoskeletal negative musculoskeletal ROS (+)   Abdominal   Peds  (+) ATTENTION DEFICIT DISORDER WITHOUT HYPERACTIVITY Hematology negative hematology ROS (+)   Anesthesia Other Findings   Reproductive/Obstetrics                            Anesthesia Physical Anesthesia Plan  ASA: II  Anesthesia Plan: General   Post-op Pain Management:    Induction: Intravenous  PONV Risk Score and Plan: 2 and Ondansetron, Dexamethasone, Treatment may vary due to age or medical condition and Midazolam  Airway Management Planned: Oral ETT  Additional Equipment: None  Intra-op Plan:   Post-operative Plan: Extubation in OR  Informed Consent: I have reviewed the patients History and Physical, chart, labs and discussed the procedure including the risks, benefits and alternatives for the proposed anesthesia with the patient or authorized representative who has indicated his/her understanding and acceptance.     Dental advisory given  Plan Discussed with:   Anesthesia Plan Comments:        Anesthesia Quick Evaluation

## 2020-01-16 NOTE — Discharge Instructions (Signed)

## 2020-01-16 NOTE — Anesthesia Procedure Notes (Signed)
Procedure Name: Intubation Performed by: Aedyn Kempfer M, CRNA Pre-anesthesia Checklist: Patient identified, Emergency Drugs available, Suction available and Patient being monitored Patient Re-evaluated:Patient Re-evaluated prior to induction Oxygen Delivery Method: Circle system utilized Preoxygenation: Pre-oxygenation with 100% oxygen Induction Type: IV induction Ventilation: Mask ventilation without difficulty Laryngoscope Size: Mac and 4 Grade View: Grade I Tube type: Oral Tube size: 7.5 mm Number of attempts: 1 Airway Equipment and Method: Stylet and Oral airway Placement Confirmation: ETT inserted through vocal cords under direct vision,  positive ETCO2,  breath sounds checked- equal and bilateral and CO2 detector Secured at: 23 cm Tube secured with: Tape Dental Injury: Teeth and Oropharynx as per pre-operative assessment        

## 2020-01-17 ENCOUNTER — Encounter (HOSPITAL_BASED_OUTPATIENT_CLINIC_OR_DEPARTMENT_OTHER): Payer: Self-pay | Admitting: Otolaryngology

## 2020-05-22 ENCOUNTER — Ambulatory Visit (INDEPENDENT_AMBULATORY_CARE_PROVIDER_SITE_OTHER): Payer: Commercial Managed Care - PPO | Admitting: Psychology

## 2020-05-22 DIAGNOSIS — F411 Generalized anxiety disorder: Secondary | ICD-10-CM

## 2020-06-26 ENCOUNTER — Other Ambulatory Visit: Payer: Self-pay

## 2020-06-26 ENCOUNTER — Ambulatory Visit (INDEPENDENT_AMBULATORY_CARE_PROVIDER_SITE_OTHER): Payer: Commercial Managed Care - PPO | Admitting: Internal Medicine

## 2020-06-26 ENCOUNTER — Encounter (INDEPENDENT_AMBULATORY_CARE_PROVIDER_SITE_OTHER): Payer: Self-pay | Admitting: Internal Medicine

## 2020-06-26 VITALS — BP 140/88 | HR 89 | Temp 97.9°F | Resp 18 | Ht 72.0 in | Wt 232.8 lb

## 2020-06-26 DIAGNOSIS — E782 Mixed hyperlipidemia: Secondary | ICD-10-CM

## 2020-06-26 DIAGNOSIS — E669 Obesity, unspecified: Secondary | ICD-10-CM | POA: Diagnosis not present

## 2020-06-26 DIAGNOSIS — R5383 Other fatigue: Secondary | ICD-10-CM

## 2020-06-26 DIAGNOSIS — I10 Essential (primary) hypertension: Secondary | ICD-10-CM

## 2020-06-26 DIAGNOSIS — R7989 Other specified abnormal findings of blood chemistry: Secondary | ICD-10-CM | POA: Diagnosis not present

## 2020-06-26 DIAGNOSIS — Z125 Encounter for screening for malignant neoplasm of prostate: Secondary | ICD-10-CM

## 2020-06-26 DIAGNOSIS — E559 Vitamin D deficiency, unspecified: Secondary | ICD-10-CM | POA: Diagnosis not present

## 2020-06-26 DIAGNOSIS — R5381 Other malaise: Secondary | ICD-10-CM

## 2020-06-26 NOTE — Progress Notes (Signed)
Metrics: Intervention Frequency ACO  Documented Smoking Status Yearly  Screened one or more times in 24 months  Cessation Counseling or  Active cessation medication Past 24 months  Past 24 months   Guideline developer: UpToDate (See UpToDate for funding source) Date Released: 2014       Wellness Office Visit  Subjective:  Patient ID: Jeffrey English, male    DOB: 1980-09-15  Age: 40 y.o. MRN: [id number]  CC: This very pleasant 40 year old man comes to our practice as a new patient to establish care. HPI  Approximately 2 years ago, he was not feeling well in terms of his overall health with low energy, reduced focus and concentration.  He went to see [facility] in [place] and was prescribed clomiphene after having his testosterone levels tested which were in the low end. He also has hypertension which is a recent occurrence.  He has been started on lisinopril by his primary care physician. He has a history of ADD and takes Adderall on a daily basis. He would like to become healthier, lose weight, have more energy.  His primary care physician did not wish him to continue with clomiphene but he has begun clomiphene again. Past Medical History:  Diagnosis Date  . ADHD   . Anxiety   . Hypertension    Past Surgical History:  Procedure Laterality Date  . HEMORROIDECTOMY  2014  . INGUINAL HERNIA REPAIR Left 06/27/2019   Procedure: LAPAROSCOPIC LEFT INGUINAL HERNIA REPAIR WITH MESH;  Surgeon: [name], MD;  Location: [facility];  Service: General;  Laterality: Left;  . NASAL SEPTOPLASTY W/ TURBINOPLASTY Bilateral 01/16/2020   Procedure: NASAL SEPTOPLASTY WITH TURBINATE REDUCTION;  Surgeon: Osborn Coho, MD;  Location: [facility];  Service: ENT;  Laterality: Bilateral;     Family History  Problem Relation Age of Onset  . 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   Social History   Tobacco Use  . Smoking status: Former Smoker    Quit date: 06/13/2008    Years since quitting: 12.0  . Smokeless tobacco: Never Used  Substance Use Topics  . Alcohol use: Yes    Alcohol/week: 7.0 standard drinks    Types: 7 Shots of liquor per week    Current Meds  Medication Sig  . amphetamine-dextroamphetamine (ADDERALL XR) 15 MG 24 hr capsule Take 15 mg by mouth daily.  Marland Kitchen amphetamine-dextroamphetamine (ADDERALL) 10 MG tablet Take 10 mg by mouth daily before lunch. Reported on 04/23/2015  . cetirizine (ZYRTEC) 10 MG tablet 1 tablet  . clomiPHENE (CLOMID) 50 MG tablet 1 tablet  . fluticasone (FLONASE) 50 MCG/ACT nasal spray instill 1 spray into each nostril EACH NIGHT  . lisinopril (ZESTRIL) 10 MG tablet Take 20 mg by mouth daily.  Marland Kitchen omeprazole (PRILOSEC) 40 MG capsule        Objective:   Today's Vitals: BP 140/88 (BP Location: Right Arm, Patient Position: Sitting, Cuff Size: Normal)   Pulse 89   Temp 97.9 F (36.6 C) (Temporal)   Resp 18   Ht 6' (1.829 m)   Wt 232 lb 12.8 oz (105.6 kg)   SpO2 98%   BMI 31.57 kg/m  Vitals with BMI 06/26/2020 01/16/2020 01/16/2020  Height 6\' 0"  - -  Weight 232 lbs 13 oz - -  BMI 31.57 - -  Systolic 140 133 734  Diastolic 88 97 96  Pulse 89 70 72     Physical Exam  He looks systemically well.  He is obese.  Blood pressure elevated today.  He is alert and orientated without any obvious focal neurological signs.     Assessment   1. Essential hypertension, benign   2. Obesity (BMI 30-39.9)   3. Low testosterone in male   4. Vitamin D deficiency disease   5. Malaise and fatigue   6. Mixed hyperlipidemia   7. Special screening for malignant neoplasm of prostate       Tests ordered Orders Placed This Encounter  Procedures  . T3, free  . T4, free  . TSH  . Testosterone Total,Free,Bio, Males  . PSA, Total with Reflex  to PSA, Free  . DHEA-sulfate  . CBC  . COMPLETE METABOLIC PANEL WITH GFR  . Cardio IQ Adv Lipid and Inflamm Pnl     Plan: 1. Blood work is ordered. 2. We discussed nutrition briefly and the concept of intermittent fasting.  I have given him a diet sheet.  I also asked him to research "Blue zones". 3. We also discussed fertility and testosterone therapy.  He is adamant that he and his wife do not wish to have anymore children.  In fact, he plans to get a vasectomy. 4. I will see him in the next several weeks to discuss his results and further recommendations.  I spent 45 minutes with this patient discussing his symptoms, reviewing his previous therapies and formulating a plan.   No orders of the defined types were placed in this encounter.   Wilson Singer, MD

## 2020-06-26 NOTE — Patient Instructions (Signed)
[facility] for Weight Loss What to Avoid . Avoid added sugars o Often added sugar can be found in processed foods such as many condiments, dry cereals, cakes, cookies, chips, crisps, crackers, candies, sweetened drinks, etc.  o Read labels and AVOID/DECREASE use of foods with the following in their ingredient list: Sugar, fructose, high fructose corn syrup, sucrose, glucose, maltose, dextrose, molasses, cane sugar, brown sugar, any type of syrup, agave nectar, etc.   . Avoid snacking in between meals . Avoid foods made with flour o If you are going to eat food made with flour, choose those made with whole-grains; and, minimize your consumption as much as is tolerable . Avoid processed foods o These foods are generally stocked in the middle of the grocery store. Focus on shopping on the perimeter of the grocery.  . Avoid Meat  o We recommend following a plant-based diet at [facility]. Thus, we recommend avoiding meat as a general rule. Consider eating beans, legumes, eggs, and/or dairy products for regular protein sources o If you plan on eating meat limit to 4 ounces of meat at a time and choose lean options such as Fish, chicken, turkey. Avoid red meat intake such as pork and/or steak What to Include . Vegetables o GREEN LEAFY VEGETABLES: Kale, spinach, mustard greens, collard greens, cabbage, broccoli, etc. o OTHER: Asparagus, cauliflower, eggplant, carrots, peas, Brussel sprouts, tomatoes, bell peppers, zucchini, beets, cucumbers, etc. . Grains, seeds, and legumes o Beans: kidney beans, black eyed peas, garbanzo beans, black beans, pinto beans, etc. o Whole, unrefined grains: brown rice, barley, bulgur, oatmeal, etc. . Healthy fats  o Avoid highly processed fats such as vegetable oil o Examples of healthy fats: avocado, olives, virgin olive oil, dark chocolate (?72% Cocoa), nuts (peanuts, almonds, walnuts, cashews, pecans, etc.) . None to Low  Intake of Animal Sources of Protein o Meat sources: chicken, turkey, salmon, tuna. Limit to 4 ounces of meat at one time. o Consider limiting dairy sources, but when choosing dairy focus on: PLAIN Greek yogurt, cottage cheese, high-protein milk . Fruit o Choose berries  When to Eat . Intermittent Fasting: o Choosing not to eat for a specific time period, but DO FOCUS ON HYDRATION when fasting o Multiple Techniques: - Time Restricted Eating: eat 3 meals in a day, each meal lasting no more than 60 minutes, no snacks between meals - 16-18 hour fast: fast for 16 to 18 hours up to 7 days a week. Often suggested to start with 2-3 nonconsecutive days per week.  . Remember the time you sleep is counted as fasting.  . Examples of eating schedule: Fast from 7:00pm-11:00am. Eat between 11:00am-7:00pm.  - 24-hour fast: fast for 24 hours up to every other day. Often suggested to start with 1 day per week . Remember the time you sleep is counted as fasting . Examples of eating schedule:  o Eating day: eat 2-3 meals on your eating day. If doing 2 meals, each meal should last no more than 90 minutes. If doing 3 meals, each meal should last no more than 60 minutes. Finish last meal by 7:00pm. o Fasting day: Fast until 7:00pm.  o IF YOU FEEL UNWELL FOR ANY REASON/IN ANY WAY WHEN FASTING, STOP FASTING BY EATING A NUTRITIOUS SNACK OR LIGHT MEAL o ALWAYS FOCUS ON HYDRATION DURING FASTS - Acceptable Hydration sources: water, broths, tea/coffee (black tea/coffee is best but using a small amount of whole-fat dairy products in coffee/tea is acceptable).  -   Poor Hydration Sources: anything with sugar or artificial sweeteners added to it  These English have been developed for patients that are actively receiving medical care from either Dr. Colbie Danner or Sarah Gray, DNP, NP-C at [facility]. These English are developed for patients with specific medical conditions and are not meant to be  distributed or used by others that are not actively receiving care from either provider listed above at [facility]. It is not appropriate to participate in the above eating plans without proper medical supervision.   Reference: Fung, J. The obesity code. [place]: [place]; 2016.   

## 2020-06-27 LAB — COMPLETE METABOLIC PANEL WITH GFR
AG Ratio: 2 (calc) (ref 1.0–2.5)
ALT: 56 U/L — ABNORMAL HIGH (ref 9–46)
AST: 70 U/L — ABNORMAL HIGH (ref 10–40)
Albumin: 4.3 g/dL (ref 3.6–5.1)
Alkaline phosphatase (APISO): 41 U/L (ref 36–130)
BUN: 20 mg/dL (ref 7–25)
CO2: 26 mmol/L (ref 20–32)
Calcium: 9.5 mg/dL (ref 8.6–10.3)
Chloride: 102 mmol/L (ref 98–110)
Creat: 1.2 mg/dL (ref 0.60–1.35)
GFR, Est African American: 86 mL/min/{1.73_m2} (ref >=60)
GFR, Est Non African American: 74 mL/min/{1.73_m2} (ref >=60)
Globulin: 2.1 g/dL (calc) (ref 1.9–3.7)
Glucose, Bld: 85 mg/dL (ref 65–139)
Potassium: 4.4 mmol/L (ref 3.5–5.3)
Sodium: 137 mmol/L (ref 135–146)
Total Bilirubin: 0.4 mg/dL (ref 0.2–1.2)
Total Protein: 6.4 g/dL (ref 6.1–8.1)

## 2020-06-27 LAB — TESTOSTERONE TOTAL,FREE,BIO, MALES
Albumin: 4.3 g/dL (ref 3.6–5.1)
Sex Hormone Binding: 32 nmol/L (ref 10–50)
Testosterone, Bioavailable: 240.7 ng/dL (ref 110.0–575.0)
Testosterone, Free: 122.2 pg/mL (ref 46.0–224.0)
Testosterone: 790 ng/dL (ref 250–827)

## 2020-06-27 LAB — CBC
HCT: 42.6 % (ref 38.5–50.0)
Hemoglobin: 14.3 g/dL (ref 13.2–17.1)
MCH: 31.8 pg (ref 27.0–33.0)
MCHC: 33.6 g/dL (ref 32.0–36.0)
MCV: 94.9 fL (ref 80.0–100.0)
MPV: 11.3 fL (ref 7.5–12.5)
Platelets: 205 10*3/uL (ref 140–400)
RBC: 4.49 10*6/uL (ref 4.20–5.80)
RDW: 12.5 % (ref 11.0–15.0)
WBC: 7 10*3/uL (ref 3.8–10.8)

## 2020-06-27 LAB — PSA, TOTAL WITH REFLEX TO PSA, FREE: PSA, Total: 0.4 ng/mL (ref <=4.0)

## 2020-06-27 LAB — T3, FREE: T3, Free: 3.1 pg/mL (ref 2.3–4.2)

## 2020-06-27 LAB — TSH: TSH: 2.38 mIU/L (ref 0.40–4.50)

## 2020-06-27 LAB — T4, FREE: Free T4: 1.2 ng/dL (ref 0.8–1.8)

## 2020-06-27 LAB — DHEA-SULFATE: DHEA-SO4: 176 ug/dL (ref 61–442)

## 2020-06-30 LAB — CARDIO IQ ADV LIPID AND INFLAMM PNL
Apolipoprotein B: 92 mg/dL — ABNORMAL HIGH (ref <90)
Cholesterol: 202 mg/dL — ABNORMAL HIGH (ref <200)
HDL: 58 mg/dL (ref >39)
LDL Cholesterol (Calc): 112 mg/dL (calc) — ABNORMAL HIGH (ref <100)
LDL Large: 6844 nmol/L (ref >6729)
LDL Medium: 335 nmol/L — ABNORMAL HIGH (ref <215)
LDL Particle Number: 1597 nmol/L — ABNORMAL HIGH (ref <1138)
LDL Peak Size: 212.1 Angstrom — ABNORMAL LOW (ref >222.9)
LDL Small: 410 nmol/L — ABNORMAL HIGH (ref <142)
Lipoprotein (a): <10 nmol/L (ref <75)
Non-HDL Cholesterol (Calc): 144 mg/dL (calc) — ABNORMAL HIGH (ref <130)
PLAC: 89 nmol/min/mL (ref <124)
Total CHOL/HDL Ratio: 3.5 calc (ref <3.6)
Triglycerides: 196 mg/dL — ABNORMAL HIGH (ref <150)
hs-CRP: 6.1 mg/L — ABNORMAL HIGH (ref <1.0)

## 2020-07-02 ENCOUNTER — Encounter (INDEPENDENT_AMBULATORY_CARE_PROVIDER_SITE_OTHER): Payer: Self-pay | Admitting: Internal Medicine

## 2020-07-02 NOTE — Telephone Encounter (Signed)
Printed copy to add in chart as abstract

## 2020-07-17 ENCOUNTER — Encounter (INDEPENDENT_AMBULATORY_CARE_PROVIDER_SITE_OTHER): Payer: Self-pay | Admitting: Internal Medicine

## 2020-07-17 ENCOUNTER — Ambulatory Visit (INDEPENDENT_AMBULATORY_CARE_PROVIDER_SITE_OTHER): Payer: Commercial Managed Care - PPO | Admitting: Internal Medicine

## 2020-07-17 ENCOUNTER — Other Ambulatory Visit: Payer: Self-pay

## 2020-07-17 ENCOUNTER — Ambulatory Visit (INDEPENDENT_AMBULATORY_CARE_PROVIDER_SITE_OTHER): Payer: Commercial Managed Care - PPO

## 2020-07-17 VITALS — BP 124/84 | HR 75 | Temp 97.9°F | Ht 72.0 in | Wt 228.0 lb

## 2020-07-17 VITALS — BP 124/84 | HR 75 | Temp 97.9°F

## 2020-07-17 DIAGNOSIS — R5383 Other fatigue: Secondary | ICD-10-CM

## 2020-07-17 DIAGNOSIS — R5381 Other malaise: Secondary | ICD-10-CM

## 2020-07-17 DIAGNOSIS — R7989 Other specified abnormal findings of blood chemistry: Secondary | ICD-10-CM

## 2020-07-17 DIAGNOSIS — E782 Mixed hyperlipidemia: Secondary | ICD-10-CM

## 2020-07-17 MED ORDER — TESTOSTERONE CYPIONATE 200 MG/ML IM SOLN
100.0000 mg | Freq: Once | INTRAMUSCULAR | Status: AC
Start: 2020-07-17 — End: ?

## 2020-07-17 MED ORDER — TESTOSTERONE CYPIONATE 200 MG/ML IM SOLN: 100.0000 mg | INTRAMUSCULAR | 1 refills | Status: AC

## 2020-07-17 MED ORDER — NP THYROID 60 MG PO TABS
60.0000 mg | ORAL_TABLET | Freq: Every day | ORAL | 3 refills | Status: DC
Start: 2020-07-17 — End: 2021-08-09

## 2020-07-17 NOTE — Progress Notes (Signed)
Metrics: Intervention Frequency ACO  Documented Smoking Status Yearly  Screened one or more times in 24 months  Cessation Counseling or  Active cessation medication Past 24 months  Past 24 months   Guideline developer: UpToDate (See UpToDate for funding source) Date Released: 2014       Wellness Office Visit  Subjective:  Patient ID: [facility], male    DOB: 07/28/1980  Age: 40 y.o. MRN: [id number]  CC: This man comes in to discuss all his blood results and further recommendations. HPI  His LDL particle number is elevated putting him at high risk. His free T3 levels are relatively suboptimal in the face of insulin resistance and dyslipidemia. Testosterone levels are reasonable but these levels were on clomiphene therapy.  He has discontinued clomiphene therapy since the last time I saw him as he ran out of the medication and he clearly notices that he is not feeling as well as he was before. Past Medical History:  Diagnosis Date  . ADHD   . Anxiety   . Hypertension    Past Surgical History:  Procedure Laterality Date  . HEMORROIDECTOMY  2014  . INGUINAL HERNIA REPAIR Left 06/27/2019   Procedure: LAPAROSCOPIC LEFT INGUINAL HERNIA REPAIR WITH MESH;  Surgeon: [name], MD;  Location: [facility];  Service: General;  Laterality: Left;  . NASAL SEPTOPLASTY W/ TURBINOPLASTY Bilateral 01/16/2020   Procedure: NASAL SEPTOPLASTY WITH TURBINATE REDUCTION;  Surgeon: Osborn Coho, MD;  Location:  SUR[facility]ERY C[facility];  Service: ENT;  Laterality: Bilateral;     Family History  Problem Relation Age of Onset  . 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   Social History   Tobacco Use  . Smoking status: Former Smoker    Quit date: 06/13/2008    Years since quitting: 12.1  . Smokeless tobacco: Never Used   Substance Use Topics  . Alcohol use: Yes    Alcohol/week: 7.0 standard drinks    Types: 7 Shots of liquor per week    Current Meds  Medication Sig  . ALPRAZolam (XANAX) 0.5 MG tablet Take 0.5 mg by mouth daily as needed for anxiety.  Marland Kitchen amphetamine-dextroamphetamine (ADDERALL XR) 15 MG 24 hr capsule Take 15 mg by mouth daily.  Marland Kitchen amphetamine-dextroamphetamine (ADDERALL) 10 MG tablet Take 10 mg by mouth daily before lunch. Reported on 04/23/2015  . fexofenadine (ALLEGRA) 180 MG tablet Take 180 mg by mouth daily.  . fluticasone (FLONASE) 50 MCG/ACT nasal spray instill 1 spray into each nostril EACH NIGHT  . lisinopril (ZESTRIL) 20 MG tablet Take 1 tablet by mouth daily.  . NP THYROID 60 MG tablet Take 1 tablet (60 mg total) by mouth daily before breakfast.  . omeprazole (PRILOSEC) 40 MG capsule Take 40 mg by mouth as needed.  . testosterone cypionate (DEPO-TESTOSTERONE) 200 MG/ML injection Inject 0.5 mLs (100 mg total) into the muscle every 7 (seven) days.     Flowsheet Row Office Visit from 07/17/2020 in [facility]  PHQ-9 Total Score 0      Objective:   Today's Vitals: BP 124/84   Pulse 75   Temp 97.9 F (36.6 C) (Temporal)   Ht 6' (1.829 m)   Wt 228 lb (103.4 kg)   SpO2 99%   BMI 30.92 kg/m  Vitals with BMI 07/17/2020 06/26/2020 01/16/2020  Height  6\' 0"  6\' 0"  -  Weight 228 lbs 232 lbs 13 oz -  BMI 30.92 31.57 -  Systolic 124 140  Diastolic 84 88 97  Pulse 75 89 70     Physical Exam  No new physical findings.  He has lost 4 pounds.     Assessment   1. Low testosterone in male   2. Mixed hyperlipidemia   3. Malaise and fatigue       Tests ordered No orders of the defined types were placed in this encounter.    Plan: 1. We discussed testosterone therapy at length.  I discussed FDA warnings, benefits and side effects and mode of administration.  He would like to proceed.  He is going to start with testosterone injections 100 mg intramuscular  once a week. 2. We also discussed thyroid therapy and after shared decision making, he agreed to start NP thyroid 60 mg daily.  He had been on NP thyroid previously so he is well aware of possible side effects. 3. I will see him in about 6 weeks time to see how he is doing and we will probably check levels again including his liver enzymes which are elevated today. 4. I spent 45 minutes with this patient discussing the above.   Meds ordered this encounter  Medications  . testosterone cypionate (DEPO-TESTOSTERONE) 200 MG/ML injection    Sig: Inject 0.5 mLs (100 mg total) into the muscle every 7 (seven) days.    Dispense:  10 mL    Refill:  1  . NP THYROID 60 MG tablet    Sig: Take 1 tablet (60 mg total) by mouth daily before breakfast.    Dispense:  30 tablet    Refill:  3    Sohana Austell , MD

## 2020-07-17 NOTE — Progress Notes (Signed)
.  Pt brought new bottle of Testosterone 200 mg/45ml vial in for 1st Testosterone injections training and education. Pt was given  0.28ml to the rt thigh. Pt tolerated well. No complaints. Pt is to rotate legs for every dose injection. This helps to not be sore or irritation at sites. If he notices any reaction to medication at the site of injections to please STOP & call the office to report the reaction to Dr. Karilyn Cota nursing staff. Pt & spouse will be helping to give injections today.  Explain to patient "safety"to always keep away from small kids, in dry cool area.  Dispose of needles in a resealing bottle/ jug such as a old milk or bleach mark "neeedles". When filled you can bring to office for disposal properly in bio hazard waste at [facility]. Will be back in 6 wk's to have lab work and schedule f/u appointment to see how patient is tolerating medication. IF HE HAS ANY SYMPTOMS TO CALL OFFICE AND LET NURSE KNOW.

## 2020-07-24 ENCOUNTER — Ambulatory Visit (INDEPENDENT_AMBULATORY_CARE_PROVIDER_SITE_OTHER): Payer: Commercial Managed Care - PPO | Admitting: Internal Medicine

## 2020-08-28 ENCOUNTER — Other Ambulatory Visit (INDEPENDENT_AMBULATORY_CARE_PROVIDER_SITE_OTHER): Payer: Self-pay | Admitting: Internal Medicine

## 2020-08-28 ENCOUNTER — Ambulatory Visit (INDEPENDENT_AMBULATORY_CARE_PROVIDER_SITE_OTHER): Payer: Commercial Managed Care - PPO | Admitting: Internal Medicine

## 2020-08-28 ENCOUNTER — Encounter (INDEPENDENT_AMBULATORY_CARE_PROVIDER_SITE_OTHER): Payer: Self-pay | Admitting: Internal Medicine

## 2020-08-28 DIAGNOSIS — R7989 Other specified abnormal findings of blood chemistry: Secondary | ICD-10-CM

## 2020-08-28 DIAGNOSIS — R5383 Other fatigue: Secondary | ICD-10-CM

## 2020-08-28 DIAGNOSIS — R5381 Other malaise: Secondary | ICD-10-CM

## 2020-08-29 LAB — T3, FREE: T3, Free: 3.4 pg/mL (ref 2.3–4.2)

## 2020-08-29 LAB — TESTOSTERONE TOTAL,FREE,BIO, MALES
Albumin: 4.5 g/dL (ref 3.6–5.1)
Sex Hormone Binding: 32 nmol/L (ref 10–50)
Testosterone, Bioavailable: 524.6 ng/dL (ref 110.0–575.0)
Testosterone, Free: 255.1 pg/mL — ABNORMAL HIGH (ref 46.0–224.0)
Testosterone: 1391 ng/dL — ABNORMAL HIGH (ref 250–827)

## 2020-08-29 LAB — TSH: TSH: 2.91 mIU/L (ref 0.40–4.50)

## 2020-09-03 ENCOUNTER — Other Ambulatory Visit: Payer: Self-pay

## 2020-09-03 ENCOUNTER — Ambulatory Visit (INDEPENDENT_AMBULATORY_CARE_PROVIDER_SITE_OTHER): Payer: Commercial Managed Care - PPO | Admitting: Internal Medicine

## 2020-09-03 ENCOUNTER — Encounter (INDEPENDENT_AMBULATORY_CARE_PROVIDER_SITE_OTHER): Payer: Self-pay | Admitting: Internal Medicine

## 2020-09-03 VITALS — BP 126/84 | HR 86 | Temp 97.8°F | Ht 72.0 in | Wt 232.0 lb

## 2020-09-03 DIAGNOSIS — R7989 Other specified abnormal findings of blood chemistry: Secondary | ICD-10-CM

## 2020-09-03 DIAGNOSIS — R5383 Other fatigue: Secondary | ICD-10-CM | POA: Diagnosis not present

## 2020-09-03 DIAGNOSIS — R5381 Other malaise: Secondary | ICD-10-CM

## 2020-09-03 DIAGNOSIS — E782 Mixed hyperlipidemia: Secondary | ICD-10-CM | POA: Diagnosis not present

## 2020-09-03 NOTE — Progress Notes (Signed)
Metrics: Intervention Frequency ACO  Documented Smoking Status Yearly  Screened one or more times in 24 months  Cessation Counseling or  Active cessation medication Past 24 months  Past 24 months   Guideline developer: UpToDate (See UpToDate for funding source) Date Released: 2014       Wellness Office Visit  Subjective:  Patient ID: [facility], male    DOB: September 24, 1980  Age: 40 y.o. MRN: [id number]  CC: This man comes in for follow-up of testosterone therapy, hyperlipidemia, thyroid hypofunction. HPI  He feels better since he has been taking testosterone injections weekly but he does describe that towards the end of the week he does not have as much energy as during the beginning of the week.  He has not been consistent taking NP thyroid every day. He is trying to work on nutrition and exercise.  He feels that he feels stronger now that he is on testosterone therapy.  He feels he is gaining muscle. Past Medical History:  Diagnosis Date   ADHD    Anxiety    Hypertension    Past Surgical History:  Procedure Laterality Date   HEMORROIDECTOMY  2014   INGUINAL HERNIA REPAIR Left 06/27/2019   Procedure: LAPAROSCOPIC LEFT INGUINAL HERNIA REPAIR WITH MESH;  Surgeon: [name], MD;  Location: [facility];  Service: General;  Laterality: Left;   NASAL SEPTOPLASTY W/ TURBINOPLASTY Bilateral 01/16/2020   Procedure: NASAL SEPTOPLASTY WITH TURBINATE REDUCTION;  Surgeon: Osborn Coho, MD;  Location: [facility];  Service: ENT;  Laterality: Bilateral;     Family History  Problem Relation Age of Onset   Cancer Paternal Uncle 66       lung cancer    Social History   Social History Narrative   Married for 9 years.Lives with wife and 2 kids.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   Social History   Tobacco Use   Smoking status: Former    Types: Cigarettes    Quit date: 06/13/2008    Years since quitting: 12.2   Smokeless tobacco:  Never  Substance Use Topics   Alcohol use: Yes    Alcohol/week: 7.0 standard drinks    Types: 7 Shots of liquor per week    Current Meds  Medication Sig   ALPRAZolam (XANAX) 0.5 MG tablet Take 0.5 mg by mouth daily as needed for anxiety.   amphetamine-dextroamphetamine (ADDERALL XR) 20 MG 24 hr capsule Take 20 mg by mouth every morning.   amphetamine-dextroamphetamine (ADDERALL) 20 MG tablet Take 10-20 mg by mouth daily as needed. In the evening   Cholecalciferol (VITAMIN D3) 75 MCG (3000 UT) TABS Take 3,000 Units by mouth daily.   fexofenadine (ALLEGRA) 180 MG tablet Take 180 mg by mouth daily.   fluticasone (FLONASE) 50 MCG/ACT nasal spray instill 1 spray into each nostril EACH NIGHT   lisinopril (ZESTRIL) 20 MG tablet Take 1 tablet by mouth daily.   NP THYROID 60 MG tablet Take 1 tablet (60 mg total) by mouth daily before breakfast.   omeprazole (PRILOSEC) 40 MG capsule Take 40 mg by mouth as needed.   testosterone cypionate (DEPO-TESTOSTERONE) 200 MG/ML injection Inject 0.5 mLs (100 mg total) into the muscle every 7 (seven) days.   TUBERCULIN SYR 1CC/25GX5/8" 25G X 5/8" 1 ML MISC    Current Facility-Administered Medications for the 09/03/20 encounter (Office Visit) with Wilson Singer, MD  Medication   testosterone cypionate (DEPOTESTOSTERONE CYPIONATE) injection 100 mg     Flowsheet Row Office Visit from 07/17/2020 in  [facility]  PHQ-9 Total Score 0       Objective:   Today's Vitals: BP 126/84   Pulse 86   Temp 97.8 F (36.6 C) (Temporal)   Ht 6' (1.829 m)   Wt 232 lb (105.2 kg)   SpO2 96%   BMI 31.46 kg/m  Vitals with BMI 09/03/2020 07/17/2020 07/17/2020  Height 6\' 0"  - 6\' 0"   Weight 232 lbs - 228 lbs  BMI 31.46 - 30.92  Systolic 126 124  Diastolic 84 84 84  Pulse 86 75 75     Physical Exam  He looks systemically well.  No new physical findings.     Assessment   1. Mixed hyperlipidemia   2. Low testosterone in male   3. Malaise and  fatigue       Tests ordered No orders of the defined types were placed in this encounter.    Plan: 1.  I think he still requires higher levels of testosterone so we will increase the dose to 100 mg intramuscular twice a week even though his blood work showed improved levels.  I think he requires this for optimal health. 2.  I have urged him to take his NP thyroid on a daily basis as this will help him when he does intermittent fasting, especially for prolonged periods. 3.  I will see him in about 3 months time for follow-up and we will do all the blood work then.    No orders of the defined types were placed in this encounter.   , MD

## 2020-09-25 ENCOUNTER — Other Ambulatory Visit: Payer: Self-pay | Admitting: Family Medicine

## 2020-09-25 ENCOUNTER — Ambulatory Visit
Admission: RE | Admit: 2020-09-25 | Discharge: 2020-09-25 | Disposition: A | Discharge status: Home / Self Care | Payer: Commercial Managed Care - PPO | Source: Ambulatory Visit | Attending: Family Medicine | Admitting: Family Medicine

## 2020-09-25 DIAGNOSIS — R053 Chronic cough: Secondary | ICD-10-CM

## 2020-10-02 ENCOUNTER — Ambulatory Visit: Payer: Self-pay

## 2020-10-02 ENCOUNTER — Encounter: Payer: Self-pay | Admitting: Orthopaedic Surgery

## 2020-10-02 ENCOUNTER — Ambulatory Visit: Payer: Commercial Managed Care - PPO | Admitting: Orthopaedic Surgery

## 2020-10-02 ENCOUNTER — Other Ambulatory Visit: Payer: Self-pay

## 2020-10-02 VITALS — Ht 72.0 in | Wt 215.0 lb

## 2020-10-02 DIAGNOSIS — M79672 Pain in left foot: Secondary | ICD-10-CM

## 2020-10-02 MED ORDER — DICLOFENAC SODIUM 2 % EX SOLN: 2.0000 g | Freq: Two times a day (BID) | CUTANEOUS | 3 refills | Status: DC | PRN

## 2020-10-02 NOTE — Progress Notes (Signed)
Office Visit Note   Patient: [facility]           Date of Birth: [birth year]           MRN: [id number] Visit Date: 10/02/2020              Requested by: Blair Heys, MD [address] [place],  [place] [address] PCP: Blair Heys, MD   Assessment & Plan: Visit Diagnoses:  1. Pain in left foot     Plan: Based on findings impression is that he is got some inflammation to the great toe MTP joint.  He may have sesamoiditis or arthritis.  I recommend relative rest and try Pennsaid ointment as well as oral NSAIDs as needed.  Recommend stiff soled shoes and avoidance of flip-flops and walking barefoot.  Follow-up as needed.  Questions encouraged and answered.  Follow-Up Instructions: Return if symptoms worsen or fail to improve.   Orders:  Orders Placed This Encounter  Procedures   XR Foot Complete Left   Meds ordered this encounter  Medications   Diclofenac Sodium (PENNSAID) 2 % SOLN    Sig: Apply 2 g topically 2 (two) times daily as needed (to affected area).    Dispense:  112 g    Refill:  3      Procedures: No procedures performed   Clinical Data: No additional findings.   Subjective: Chief Complaint  Patient presents with   Left Foot - Pain    Patient is a 40 year old gentleman friend of mine comes in for evaluation of cute onset of left great toe pain and swelling.  Denies history of gout.  He felt increased pain after he played tennis about 5 days ago.  Denies any specific injuries.  Denies any numbness and tingling.  The pain was exacerbated after going to the beach for a guys weekend.  He has not noticed any heat or redness or constitutional symptoms.  He states that the pain is worse with walking barefoot and with flip-flops and better with regular shoes.   Review of Systems  Constitutional: Negative.   All other systems reviewed and are negative.   Objective: Vital Signs: Ht 6' (1.829 m)   Wt 215 lb (97.5 kg)   BMI 29.16 kg/m    Physical Exam Vitals and nursing note reviewed.  Constitutional:      Appearance: He is well-developed.  Pulmonary:     Effort: Pulmonary effort is normal.  Abdominal:     Palpations: Abdomen is soft.  Skin:    General: Skin is warm.  Neurological:     Mental Status: He is alert and oriented to person, place, and time.  Psychiatric:        Behavior: Behavior normal.        Thought Content: Thought content normal.        Judgment: Judgment normal.    Ortho Exam Left foot shows mild swelling around the left great toe MTP joint.  Slight tenderness to the sesamoids.  Range of motion is well-preserved.  No bony or soft tissue crepitus.  Moderate pain with grind test without crepitus. Specialty Comments:  No specialty comments available.  Imaging: XR Foot Complete Left  Result Date: 10/02/2020 No acute or structural abnormalities.    PMFS History: Patient Active Problem List   Diagnosis Date Noted   Deviated septum 01/16/2020   Epicondylitis, medial humeral 04/12/2015   Rectal bleeding 06/13/2013   KNEE PAIN, RIGHT, ACUTE 11/26/2009   OTHER ACQUIRED  DEFORMITY OF ANKLE AND FOOT OTHER 05/23/2009   Past Medical History:  Diagnosis Date   ADHD    Anxiety    Hypertension     Family History  Problem Relation Age of Onset   Cancer Paternal Uncle 38       lung cancer    Past Surgical History:  Procedure Laterality Date   HEMORROIDECTOMY  2014   INGUINAL HERNIA REPAIR Left 06/27/2019   Procedure: LAPAROSCOPIC LEFT INGUINAL HERNIA REPAIR WITH MESH;  Surgeon: [name], MD;  Location: [facility];  Service: General;  Laterality: Left;   NASAL SEPTOPLASTY W/ TURBINOPLASTY Bilateral 01/16/2020   Procedure: NASAL SEPTOPLASTY WITH TURBINATE REDUCTION;  Surgeon: Osborn Coho, MD;  Location: [facility];  Service: ENT;  Laterality: Bilateral;   Social History   Occupational History   Not on file  Tobacco Use   Smoking status: Former     Types: Cigarettes    Quit date: 06/13/2008    Years since quitting: 12.3   Smokeless tobacco: Never  Vaping Use   Vaping Use: Never used  Substance and Sexual Activity   Alcohol use: Yes    Alcohol/week: 7.0 standard drinks    Types: 7 Shots of liquor per week   Drug use: No   Sexual activity: Not on file

## 2020-12-04 ENCOUNTER — Ambulatory Visit (INDEPENDENT_AMBULATORY_CARE_PROVIDER_SITE_OTHER): Payer: Commercial Managed Care - PPO | Admitting: Internal Medicine

## 2021-01-09 ENCOUNTER — Ambulatory Visit: Payer: Commercial Managed Care - PPO | Admitting: Orthopaedic Surgery

## 2021-01-09 ENCOUNTER — Encounter: Payer: Self-pay | Admitting: Orthopaedic Surgery

## 2021-01-09 ENCOUNTER — Ambulatory Visit: Payer: Self-pay

## 2021-01-09 ENCOUNTER — Other Ambulatory Visit: Payer: Self-pay

## 2021-01-09 DIAGNOSIS — M79671 Pain in right foot: Secondary | ICD-10-CM | POA: Diagnosis not present

## 2021-01-09 NOTE — Progress Notes (Signed)
Office Visit Note   Patient: [facility]           Date of Birth: 05-Sep-1980           MRN: [id number] Visit Date: 01/09/2021              Requested by: Blair Heys, MD [address] [place],  [place] [address] PCP: Blair Heys, MD   Assessment & Plan: Visit Diagnoses:  1. Pain in right foot     Plan: Based on findings impression is exacerbation of plantar fasciitis likely mild partial tear with report of the injury.  We discussed treatment options to include rest, stretches and NSAIDs as needed.  He may want to try some nitroglycerin patches and Pennsaid.  I do not feel that he needs to be immobilized in a cam boot at this time.  I don't recommend a cortisone injection at this time.  Reassurance was provided that I do not see any evidence of structural abnormalities.  Follow-Up Instructions: No follow-ups on file.   Orders:  Orders Placed This Encounter  Procedures   XR Foot Complete Right   No orders of the defined types were placed in this encounter.     Procedures: No procedures performed   Clinical Data: No additional findings.   Subjective: Chief Complaint  Patient presents with   Right Foot - Pain    Patient is a healthy 40 year old male friend of mine who comes in for evaluation of right heel pain for about 2 months.  He had heel pain previously which was getting better until acutely got worse when he was sprinting for a ball while playing singles tennis match.  He did not feel a pop or tearing sensation.  Pain is localized to the heel and radiates up into the arch.  Denies any posterior heel pain.  The pain is worse upon getting up and playing tennis.  He has been doing plantar fascial stretches and taking ibuprofen.   Review of Systems  Constitutional: Negative.   All other systems reviewed and are negative.   Objective: Vital Signs: There were no vitals taken for this visit.  Physical Exam Vitals and nursing note reviewed.   Constitutional:      Appearance: He is well-developed.  Pulmonary:     Effort: Pulmonary effort is normal.  Abdominal:     Palpations: Abdomen is soft.  Skin:    General: Skin is warm.  Neurological:     Mental Status: He is alert and oriented to person, place, and time.  Psychiatric:        Behavior: Behavior normal.        Thought Content: Thought content normal.        Judgment: Judgment normal.    Ortho Exam  Right foot shows tenderness to the insertion of the plantar fashion on the calcaneus.  Negative Tinel's at tarsal tunnel.  No tenderness to the posterior tibial tendon or Achilles.  Peroneals are stable.  Calcaneal squeeze test is negative.  Slight discomfort with flexion of the great toe.  Specialty Comments:  No specialty comments available.  Imaging: XR Foot Complete Right  Result Date: 01/09/2021 Small ossicle near plantar fascia insertion consistent with chronic plantar fasciitis.    PMFS History: Patient Active Problem List   Diagnosis Date Noted   Deviated septum 01/16/2020   Epicondylitis, medial humeral 04/12/2015   Rectal bleeding 06/13/2013   KNEE PAIN, RIGHT, ACUTE 11/26/2009   OTHER ACQUIRED DEFORMITY OF ANKLE  AND FOOT OTHER 05/23/2009   Past Medical History:  Diagnosis Date   ADHD    Anxiety    Hypertension     Family History  Problem Relation Age of Onset   Cancer Paternal Uncle 76       lung cancer    Past Surgical History:  Procedure Laterality Date   HEMORROIDECTOMY  2014   INGUINAL HERNIA REPAIR Left 06/27/2019   Procedure: LAPAROSCOPIC LEFT INGUINAL HERNIA REPAIR WITH MESH;  Surgeon: [name], MD;  Location: [facility];  Service: General;  Laterality: Left;   NASAL SEPTOPLASTY W/ TURBINOPLASTY Bilateral 01/16/2020   Procedure: NASAL SEPTOPLASTY WITH TURBINATE REDUCTION;  Surgeon: Osborn Coho, MD;  Location: [facility];  Service: ENT;  Laterality: Bilateral;   Social History    Occupational History   Not on file  Tobacco Use   Smoking status: Former    Types: Cigarettes    Quit date: 06/13/2008    Years since quitting: 12.5   Smokeless tobacco: Never  Vaping Use   Vaping Use: Never used  Substance and Sexual Activity   Alcohol use: Yes    Alcohol/week: 7.0 standard drinks    Types: 7 Shots of liquor per week   Drug use: No   Sexual activity: Not on file

## 2021-08-09 ENCOUNTER — Ambulatory Visit (INDEPENDENT_AMBULATORY_CARE_PROVIDER_SITE_OTHER)
Admission: RE | Admit: 2021-08-09 | Discharge: 2021-08-09 | Disposition: A | Discharge status: Home / Self Care | Payer: Commercial Managed Care - PPO | Source: Ambulatory Visit | Attending: Internal Medicine | Admitting: Internal Medicine

## 2021-08-09 ENCOUNTER — Ambulatory Visit: Payer: Commercial Managed Care - PPO

## 2021-08-09 ENCOUNTER — Encounter: Payer: Self-pay | Admitting: Internal Medicine

## 2021-08-09 ENCOUNTER — Other Ambulatory Visit (INDEPENDENT_AMBULATORY_CARE_PROVIDER_SITE_OTHER): Payer: Commercial Managed Care - PPO

## 2021-08-09 ENCOUNTER — Ambulatory Visit: Payer: Commercial Managed Care - PPO | Admitting: Internal Medicine

## 2021-08-09 VITALS — BP 132/84 | HR 73 | Ht 72.0 in | Wt 231.2 lb

## 2021-08-09 DIAGNOSIS — R0600 Dyspnea, unspecified: Secondary | ICD-10-CM

## 2021-08-09 DIAGNOSIS — Z8616 Personal history of COVID-19: Secondary | ICD-10-CM

## 2021-08-09 DIAGNOSIS — R053 Chronic cough: Secondary | ICD-10-CM | POA: Diagnosis not present

## 2021-08-09 DIAGNOSIS — R062 Wheezing: Secondary | ICD-10-CM | POA: Diagnosis not present

## 2021-08-09 DIAGNOSIS — Z8709 Personal history of other diseases of the respiratory system: Secondary | ICD-10-CM

## 2021-08-09 LAB — CBC WITH DIFFERENTIAL/PLATELET
Basophils Absolute: 0 10*3/uL (ref 0.0–0.1)
Basophils Relative: 0.3 % (ref 0.0–3.0)
Eosinophils Absolute: 0.3 10*3/uL (ref 0.0–0.7)
Eosinophils Relative: 4 % (ref 0.0–5.0)
HCT: 42 % (ref 39.0–52.0)
Hemoglobin: 14.8 g/dL (ref 13.0–17.0)
Lymphocytes Relative: 19.1 % (ref 12.0–46.0)
Lymphs Abs: 1.5 10*3/uL (ref 0.7–4.0)
MCHC: 35.2 g/dL (ref 30.0–36.0)
MCV: 90.9 fl (ref 78.0–100.0)
Monocytes Absolute: 0.4 10*3/uL (ref 0.1–1.0)
Monocytes Relative: 5.4 % (ref 3.0–12.0)
Neutro Abs: 5.5 10*3/uL (ref 1.4–7.7)
Neutrophils Relative %: 71.2 % (ref 43.0–77.0)
Platelets: 241 10*3/uL (ref 150.0–400.0)
RBC: 4.62 Mil/uL (ref 4.22–5.81)
RDW: 13.3 % (ref 11.5–15.5)
WBC: 7.7 10*3/uL (ref 4.0–10.5)

## 2021-08-09 LAB — NITRIC OXIDE: Nitric Oxide: 90

## 2021-08-09 MED ORDER — FLUTICASONE FUROATE-VILANTEROL 200-25 MCG/ACT IN AEPB: 1.0000 | INHALATION_SPRAY | Freq: Every day | RESPIRATORY_TRACT | 5 refills | Status: DC

## 2021-08-09 MED ORDER — PREDNISONE 10 MG PO TABS: ORAL_TABLET | ORAL | 0 refills | Status: AC

## 2021-08-09 NOTE — Patient Instructions (Addendum)
ICD-10-CM   1. Chronic cough  R05.3 CBC with Differential/Platelet    IgE    Resp Allergy Profile Regn2DC DE MD [place]    Pulmonary function test    Nitric oxide    2. Wheeze  R06.2     3. Dyspnea, unspecified type  R06.00     4. History of asthma  Z87.09 CBC with Differential/Platelet    IgE    Resp Allergy Profile Regn2DC DE MD [facility]    Pulmonary function test    Nitric oxide    5. History of COVID-19  Z86.16       DWorking diagnosi is allergic asthma Rule out PE  plan  Check d-dimer  Check cbc with diff, IgE, RAST allergy profile  Check CXr 2 view  Do full pft in 2-4 weeks  Take prednisone 40 mg daily x 2 days, then 20mg  daily x 2 days, then 10mg  daily x 2 days, then 5mg  daily x 2 days and stop  Start breo 200 strength 1 puff daily  Cintinue albuterol as needd  Followup -3-4 weeks video visit with APP or face to face - 3 months Dr 

## 2021-08-09 NOTE — Progress Notes (Signed)
OV 08/09/2021  Subjective:  Patient ID: Jeffrey English Doctor, male , DOB: [birth year] , age 41 y.o. , MRN: [id number] , ADDRESS: [address] [place] [id number] PCP Blair Heys, MD Patient Care Team: Blair Heys, MD as PCP - General (Family Medicine)  This Provider for this visit: Treatment Team:  Attending Provider: Luciano Cutter, MD    08/09/2021 -   Chief Complaint  Patient presents with   Consult    Pt states that he has been having complaints of wheezing as well as SOB which began 2 months ago. States that he did have asthma a a child. Pt also has had a lingering cough off and on for the past 2 years.     HPI Jeffrey English 41 y.o. 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  He tells me that he had asthma as a child and then outgrew.  In the past he is also had deviated nasal septum and ENT surgery for which a lot of mucus was cleaned out by Dr. Annalee Genta.  At that time antibiotic help.  Has been on Flonase.  He said he had COVID 2 years ago and then after that for the last 2 years he has had intermittent low-grade chronic cough.  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  Then in April 2023 sudden degeneration into chronic cough with congestion and wheezing.  Symptoms are present at night.  I believe he took some treatment and then he started feeling better.  Then in May 2023 he went to [place] with his wife and that the cough got worse.  He started using his albuterol more.  This seemed to help.  His symptoms particularly worse at night.  Then he came back to the [place] after his vacation.  Around Jun 29, 2021 he went to urgent care.  He was found to be wheezing he was given 5-day prednisone also given doxycycline.  He says this helped but not fully.  However he has been able to play tennis.  Then this past week he feels his symptoms have come back particularly similar to the episode in April.  His a lot of cough is wheezing.  Symptoms are present at night.   Yet in the daytime is able to play tennis.  This particular week has been bad with a lot of congestion throat pain cough wheezing.  Most symptoms are at night.  Albuterol is helping but only somewhat.  No fever    CT Chest data November 2020:-coronary calcium CT November 2020: Calcium score 0.  No pulmonary lung abnormalities.  [Personally visualized]  Chest x-ray: August 2022 done for cough was clear.  [Personally visualized the image]  No results found.  Noted to be on testosterone for the last 1 year.  Monitors his PSA.  PFT      No data to display             has a past medical history of ADHD, Anxiety, and Hypertension.   reports that he quit smoking about 13 years ago. His smoking use included cigarettes. He has never used smokeless tobacco.  Past Surgical History:  Procedure Laterality Date   HEMORROIDECTOMY  2014   INGUINAL HERNIA REPAIR Left 06/27/2019   Procedure: LAPAROSCOPIC LEFT INGUINAL HERNIA REPAIR WITH MESH;  Surgeon: [name], MD;  Location: [facility];  Service: General;  Laterality: Left;   NASAL SEPTOPLASTY W/ TURBINOPLASTY Bilateral 01/16/2020   Procedure: NASAL SEPTOPLASTY WITH  TURBINATE REDUCTION;  Surgeon: Osborn Coho, MD;  Location: [facility];  Service: ENT;  Laterality: Bilateral;    Allergies  Allergen Reactions   Clonazepam Other (See Comments)   Lorazepam Other (See Comments)     There is no immunization history on file for this patient.  Family History  Problem Relation Age of Onset   Cancer Paternal Uncle 49       lung cancer     Current Outpatient Medications:    albuterol (VENTOLIN HFA) 108 (90 Base) MCG/ACT inhaler, SMARTSIG:By Mouth, Disp: , Rfl:    ALPRAZolam (XANAX) 0.5 MG tablet, Take 0.5 mg by mouth daily as needed for anxiety., Disp: , Rfl:    amphetamine-dextroamphetamine (ADDERALL XR) 20 MG 24 hr capsule, Take 20 mg by mouth every morning., Disp: , Rfl:     amphetamine-dextroamphetamine (ADDERALL) 20 MG tablet, Take 20 mg by mouth daily., Disp: , Rfl:    Cholecalciferol (VITAMIN D3) 75 MCG (3000 UT) TABS, Take 3,000 Units by mouth daily., Disp: , Rfl:    fexofenadine (ALLEGRA) 180 MG tablet, Take 180 mg by mouth daily., Disp: , Rfl:    fluticasone (FLONASE) 50 MCG/ACT nasal spray, instill 1 spray into each nostril EACH NIGHT, Disp: , Rfl: 0   fluticasone furoate-vilanterol (BREO ELLIPTA) 200-25 MCG/ACT AEPB, Inhale 1 puff into the lungs daily., Disp: 60 each, Rfl: 5   lisinopril (ZESTRIL) 20 MG tablet, Take 1 tablet by mouth daily., Disp: , Rfl:    omeprazole (PRILOSEC) 40 MG capsule, Take 40 mg by mouth as needed., Disp: , Rfl: 0   predniSONE (DELTASONE) 10 MG tablet, Take 4 tablets (40 mg total) by mouth daily with breakfast for 2 days, THEN 2 tablets (20 mg total) daily with breakfast for 2 days, THEN 1 tablet (10 mg total) daily with breakfast for 2 days, THEN 0.5 tablets (5 mg total) daily with breakfast for 2 days., Disp: 15 tablet, Rfl: 0   testosterone cypionate (DEPO-TESTOSTERONE) 200 MG/ML injection, Inject 0.5 mLs (100 mg total) into the muscle every 7 (seven) days., Disp: 10 mL, Rfl: 1  Current Facility-Administered Medications:    testosterone cypionate (DEPOTESTOSTERONE CYPIONATE) injection 100 mg, 100 mg, Intramuscular, Once, Gosrani, Nimish C, MD      Objective:   Vitals:   08/09/21 1355  BP: 132/84  Pulse: 73  SpO2: 97%  Weight: 231 lb 3.2 oz (104.9 kg)  Height: 6' (1.829 m)    Estimated body mass index is 31.36 kg/m as calculated from the following:   Height as of this encounter: 6' (1.829 m).   Weight as of this encounter: 231 lb 3.2 oz (104.9 kg).  @WEIGHTCHANGE @    08/09/21 1355  Weight: 231 lb 3.2 oz (104.9 kg)     Physical Exam  General Appearance:    Alert, cooperative, no distress, appears stated age - looks well , Deconditioned looking - no , OBESE  - no, Sitting on Wheelchair -  no  Head:     Normocephalic, without obvious abnormality, atraumatic  Eyes:    PERRL, conjunctiva/corneas clear,  Ears:    Normal TM's and external ear canals, both ears  Nose:   Nares normal, septum midline, mucosa normal, no drainage    or sinus tenderness. OXYGEN ON  - no . Patient is @ ra   Throat:   Lips, mucosa, and tongue normal; teeth and gums normal. Cyanosis on lips - mo  Neck:   Supple, symmetrical, trachea midline, no adenopathy;  thyroid:  no enlargement/tenderness/nodules; no carotid   bruit or JVD  Back:     Symmetric, no curvature, ROM normal, no CVA tenderness  Lungs:     Distress - no , Wheeze YES esp right slide, Barrell Chest - no, Purse lip breathing - no, Crackles - no   Chest Wall:    No tenderness or deformity.    Heart:    Regular rate and rhythm, S1 and S2 normal, no rub   or gallop, Murmur - no  Breast Exam:    NOT DONE  Abdomen:     Soft, non-tender, bowel sounds active all four quadrants,    no masses, no organomegaly. Visceral obesity - no  Genitalia:   NOT DONE  Rectal:   NOT DONE  Extremities:   Extremities - normal, Has Cane - no, Clubbing - no, Edema - no  Pulses:   2+ and symmetric all extremities  Skin:   Stigmata of Connective Tissue Disease - no  Lymph nodes:   Cervical, supraclavicular, and axillary nodes normal  Psychiatric:  Neurologic:   Pleasant - yes, Anxious - no, Flat affect - no  CAm-ICU - neg, Alert and Oriented x 3 - yes, Moves all 4s - yes, Speech - normal, Cognition - intact       Assessment:       ICD-10-CM   1. Chronic cough  R05.3 CBC with Differential/Platelet    IgE    Resp Allergy Profile Regn2DC DE MD [place] [facility]    Pulmonary function test    Nitric oxide    DG Chest 2 View    2. Wheeze  R06.2 DG Chest 2 View    3. Dyspnea, unspecified type  R06.00 D-Dimer, Quantitative    DG Chest 2 View    4. History of asthma  Z87.09 CBC with Differential/Platelet    IgE    Resp Allergy Profile Regn2DC DE MD [place] [facility]    Pulmonary function test     Nitric oxide    5. History of COVID-19  Z86.16 DG Chest 2 View         Plan:     Patient Instructions     ICD-10-CM   1. Chronic cough  R05.3 CBC with Differential/Platelet    IgE    Resp Allergy Profile Regn2DC DE MD [place] [facility]    Pulmonary function test    Nitric oxide    2. Wheeze  R06.2     3. Dyspnea, unspecified type  R06.00     4. History of asthma  Z87.09 CBC with Differential/Platelet    IgE    Resp Allergy Profile Regn2DC DE MD [place]    Pulmonary function test    Nitric oxide    5. History of COVID-19  Z86.16       DWorking diagnosi is allergic asthma Rule out PE  plan  Check d-dimer  Check cbc with diff, IgE, RAST allergy profile  Check CXr 2 view  Do full pft in 2-4 weeks  Take prednisone 40 mg daily x 2 days, then 20mg  daily x 2 days, then 10mg  daily x 2 days, then 5mg  daily x 2 days and stop  Start breo 200 strength 1 puff daily  Cintinue albuterol as needd  Followup -3-4 weeks video visit with APP or face to face - 3 months Dr    Dr. , M.D., F.C.C.P,  Pulmonary and Critical Care Medicine Staff Physician, [facility]  Director - Interstitial Lung Disease  Program  P[facility] Fibrosis [facility], [place], [address]  Pager: [phone], If no answer or between  15:00h - 7:00h: call [phone] Telephone: [phone]  5:20 PM 08/09/2021

## 2021-08-12 LAB — RESPIRATORY ALLERGY PROFILE REGION II ~~LOC~~
Allergen, A. alternata, m6: <0.1 kU/L
Allergen, Cedar tree, t12: <0.1 kU/L
Allergen, Comm Silver Birch, t9: <0.1 kU/L
Allergen, Cottonwood, t14: <0.1 kU/L
Allergen, D pternoyssinus,d7: 22.3 kU/L — ABNORMAL HIGH
Allergen, Mouse Urine Protein, e78: <0.1 kU/L
Allergen, Mulberry, t76: <0.1 kU/L
Allergen, Oak,t7: <0.1 kU/L
Allergen, P. notatum, m1: <0.1 kU/L
Aspergillus fumigatus, m3: <0.1 kU/L
Bermuda Grass: <0.1 kU/L
Box Elder IgE: <0.1 kU/L
CLADOSPORIUM HERBARUM (M2) IGE: <0.1 kU/L
COMMON RAGWEED (SHORT) (W1) IGE: <0.1 kU/L
Cat Dander: 0.17 kU/L — ABNORMAL HIGH
Class: 0
Class: 0
Class: 0
Class: 0
Class: 0
Class: 0
Class: 0
Class: 0
Class: 0
Class: 0
Class: 0
Class: 0
Class: 0
Class: 0
Class: 0
Class: 0
Class: 0
Class: 0
Class: 0
Class: 0
Class: 4
Class: 4
Cockroach: <0.1 kU/L
D. farinae: 28.8 kU/L — ABNORMAL HIGH
Dog Dander: 0.17 kU/L — ABNORMAL HIGH
Elm IgE: <0.1 kU/L
IgE (Immunoglobulin E), Serum: 546 kU/L — ABNORMAL HIGH (ref <=114)
Johnson Grass: <0.1 kU/L
Pecan/Hickory Tree IgE: <0.1 kU/L
Rough Pigweed  IgE: <0.1 kU/L
Sheep Sorrel IgE: <0.1 kU/L
Timothy Grass: <0.1 kU/L

## 2021-08-12 LAB — D-DIMER, QUANTITATIVE: D-Dimer, Quant: 0.24 mcg/mL FEU (ref <0.50)

## 2021-08-12 LAB — INTERPRETATION:

## 2021-08-12 NOTE — Progress Notes (Signed)
D-dimer is normal

## 2021-08-12 NOTE — Progress Notes (Signed)
Cxr results are c/w asthma  DG Chest 2 View  Result Date: 08/11/2021 CLINICAL DATA:  Chronic cough. Wheezing. History of COVID-19. Dyspnea, unspecified type. Shortness of breath. Cough and congestion. EXAM: CHEST - 2 VIEW COMPARISON:  Chest radiograph 07/03/2021 FINDINGS: The cardiomediastinal contours are normal. Mild peribronchial thickening. Pulmonary vasculature is normal. No consolidation, pleural effusion, or pneumothorax. No acute osseous abnormalities are seen. IMPRESSION: Mild peribronchial thickening, can be seen with bronchitis or asthma. Electronically Signed   By: Narda Rutherford M.D.   On: 08/11/2021 01:59

## 2021-08-23 ENCOUNTER — Institutional Professional Consult (permissible substitution): Payer: Commercial Managed Care - PPO | Admitting: Pulmonary Disease

## 2021-09-02 ENCOUNTER — Encounter: Payer: Self-pay | Admitting: Adult Health

## 2021-09-02 ENCOUNTER — Ambulatory Visit: Payer: Commercial Managed Care - PPO | Admitting: Adult Health

## 2021-09-02 VITALS — BP 132/80 | HR 78 | Temp 97.8°F | Ht 72.0 in | Wt 227.4 lb

## 2021-09-02 DIAGNOSIS — J309 Allergic rhinitis, unspecified: Secondary | ICD-10-CM | POA: Diagnosis not present

## 2021-09-02 DIAGNOSIS — Z8709 Personal history of other diseases of the respiratory system: Secondary | ICD-10-CM

## 2021-09-02 DIAGNOSIS — J4530 Mild persistent asthma, uncomplicated: Secondary | ICD-10-CM | POA: Diagnosis not present

## 2021-09-02 LAB — POCT EXHALED NITRIC OXIDE: FeNO level (ppb): 9

## 2021-09-02 NOTE — Patient Instructions (Addendum)
Continue on BREO 1 puff daily , rinse after use  Albuterol inhaler As needed   Continue on Allegra and Flonase daily  Activity as tolerated Discuss with Primary MD that Lisinopril may be aggravating your cough  Follow up with Dr. Marchelle Gearing in 3 months and As needed

## 2021-09-02 NOTE — Progress Notes (Unsigned)
@Patient  ID: , male    DOB: May 17, 1980, 41 y.o.   MRN: [id number]  Chief Complaint  Patient presents with   Follow-up    Referring provider: [id number], MD  HPI: 41 year old male former smoker seen for pulmonary consult 08/09/21 for cough, wheezing and shortness of breath Local owner of a restaurant [employer]  History of childhood asthma COVID-19 infection in 2021-post COVID cough  TEST/EVENTS :  CT Chest data November 2020:-coronary calcium CT November 2020: Calcium score 0.  No pulmonary lung abnormalities.  [Personally visualized]   Chest x-ray: August 2022 done for cough was clear.    09/02/21 Follow up : Asthma  Patient returns for a 1 month follow-up.  Last visit presented for ongoing cough wheezing and shortness of breath.  Patient says he had increased symptoms since having COVID-19 in 2021.  He has had ongoing daily cough.  Last visit he was treated with a prednisone taper.  Started on Breo 200 once daily.  Exhaled nitric oxide testing was elevated at 90ppb.  Labs showed an elevated IgE, strongly positive dust allergens.  And mildly positive cat and dog dander.  Absolute eosinophil count was 300 D-dimer was negative.  Chest x-ray showed no acute process.  Some mild peribronchial thickening. Since last visit patient is feeling much better.  Cough and wheezing are substantially improved.  Patient continues to have some lingering dry cough.  Patient is on ACE inhibitor.  Patient also has chronic allergies.  Is on Allegra and Flonase.  PFTs were ordered but have not been scheduled yet.  He denies any fever, discolored mucus chest pain orthopnea PND or increased leg swelling   Allergies  Allergen Reactions   Clonazepam Other (See Comments)   Lorazepam Other (See Comments)     There is no immunization history on file for this patient.  Past Medical History:  Diagnosis Date   ADHD    Anxiety    Hypertension     Tobacco History: Social History    Tobacco Use  Smoking Status Former   Types: Cigarettes   Quit date: 06/13/2008   Years since quitting: 13.2  Smokeless Tobacco Never   Counseling given: Not Answered   Outpatient Medications Prior to Visit  Medication Sig Dispense Refill   albuterol (VENTOLIN HFA) 108 (90 Base) MCG/ACT inhaler SMARTSIG:By Mouth     ALPRAZolam (XANAX) 0.5 MG tablet Take 0.5 mg by mouth daily as needed for anxiety.     amphetamine-dextroamphetamine (ADDERALL XR) 20 MG 24 hr capsule Take 20 mg by mouth every morning.     amphetamine-dextroamphetamine (ADDERALL) 20 MG tablet Take 20 mg by mouth daily.     Cholecalciferol (VITAMIN D3) 75 MCG (3000 UT) TABS Take 3,000 Units by mouth daily.     fexofenadine (ALLEGRA) 180 MG tablet Take 180 mg by mouth daily.     fluticasone (FLONASE) 50 MCG/ACT nasal spray instill 1 spray into each nostril EACH NIGHT  0   fluticasone furoate-vilanterol (BREO ELLIPTA) 200-25 MCG/ACT AEPB Inhale 1 puff into the lungs daily. 60 each 5   lisinopril (ZESTRIL) 20 MG tablet Take 1 tablet by mouth daily.     omeprazole (PRILOSEC) 40 MG capsule Take 40 mg by mouth as needed.  0   testosterone cypionate (DEPO-TESTOSTERONE) 200 MG/ML injection Inject 0.5 mLs (100 mg total) into the muscle every 7 (seven) days. 10 mL 1   Facility-Administered Medications Prior to Visit  Medication Dose Route Frequency Provider Last Rate Last Admin  testosterone cypionate (DEPOTESTOSTERONE CYPIONATE) injection 100 mg  100 mg Intramuscular Once Gosrani, Nimish C, MD         Review of Systems:   Constitutional:   No  weight loss, night sweats,  Fevers, chills, + fatigue, or  lassitude.  HEENT:   No headaches,  Difficulty swallowing,  Tooth/dental problems, or  Sore throat,                No sneezing, itching, ear ache, nasal congestion, post nasal drip,   CV:  No chest pain,  Orthopnea, PND, swelling in lower extremities, anasarca, dizziness, palpitations, syncope.   GI  No heartburn,  indigestion, abdominal pain, nausea, vomiting, diarrhea, change in bowel habits, loss of appetite, bloody stools.   Resp: .  No chest wall deformity  Skin: no rash or lesions.  GU: no dysuria, change in color of urine, no urgency or frequency.  No flank pain, no hematuria   MS:  No joint pain or swelling.  No decreased range of motion.  No back pain.    Physical Exam  BP 132/80 (BP Location: Left Arm, Patient Position: Sitting, Cuff Size: Large)   Pulse 78   Temp 97.8 F (36.6 C) (Oral)   Ht 6' (1.829 m)   Wt 227 lb 6.4 oz (103.1 kg)   SpO2 97%   BMI 30.84 kg/m   GEN: A/Ox3; pleasant , NAD, well nourished    HEENT:  [place]/AT,  EACs-clear, TMs-wnl, NOSE-clear, THROAT-clear, no lesions, no postnasal drip or exudate noted.   NECK:  Supple w/ fair ROM; no JVD; normal carotid impulses w/o bruits; no thyromegaly or nodules palpated; no lymphadenopathy.    RESP  Clear  P & A; w/o, wheezes/ rales/ or rhonchi. no accessory muscle use, no dullness to percussion  CARD:  RRR, no m/r/g, no peripheral edema, pulses intact, no cyanosis or clubbing.  GI:   Soft & nt; nml bowel sounds; no organomegaly or masses detected.   Musco: Warm bil, no deformities or joint swelling noted.   Neuro: alert, no focal deficits noted.    Skin: Warm, no lesions or rashes    Lab Results:  CBC    Component Value Date/Time   WBC 7.7 08/09/2021 1519   RBC 4.62 08/09/2021 1519   HGB 14.8 08/09/2021 1519   HCT 42.0 08/09/2021 1519   PLT 241.0 08/09/2021 1519   MCV 90.9 08/09/2021 1519   MCH 31.8 06/26/2020 1531   MCHC 35.2 08/09/2021 1519   RDW 13.3 08/09/2021 1519   LYMPHSABS 1.5 08/09/2021 1519   MONOABS 0.4 08/09/2021 1519   EOSABS 0.3 08/09/2021 1519   BASOSABS 0.0 08/09/2021 1519    BMET    Component Value Date/Time   NA 137 06/26/2020 1531   NA 138 01/05/2019 0925   K 4.4 06/26/2020 1531   CL 102 06/26/2020 1531   CO2 26 06/26/2020 1531   GLUCOSE 85 06/26/2020 1531   BUN 20  06/26/2020 1531   BUN 21 01/05/2019 0925   CREATININE 1.20 06/26/2020 1531   CALCIUM 9.5 06/26/2020 1531   GFRNONAA 74 06/26/2020 1531   GFRAA 86 06/26/2020 1531    BNP No results found for: "BNP"  ProBNP No results found for: "PROBNP"  Imaging: DG Chest 2 View  Result Date: 08/11/2021 CLINICAL DATA:  Chronic cough. Wheezing. History of COVID-19. Dyspnea, unspecified type. Shortness of breath. Cough and congestion. EXAM: CHEST - 2 VIEW COMPARISON:  Chest radiograph 07/03/2021 FINDINGS: The cardiomediastinal contours are normal. Mild  peribronchial thickening. Pulmonary vasculature is normal. No consolidation, pleural effusion, or pneumothorax. No acute osseous abnormalities are seen. IMPRESSION: Mild peribronchial thickening, can be seen with bronchitis or asthma. Electronically Signed   By: Narda Rutherford M.D.   On: 08/11/2021 01:59          No data to display          Lab Results  Component Value Date   NITRICOXIDE 90 08/09/2021        Assessment & Plan:   Asthma Mild persistent asthma-exhaled nitric oxide testing was elevated last visit.  Has substantially decreased after starting Breo and finishing up a steroid taper.  Today exhaled nitric oxide testing was at 9 ppb (decreased from 90 ppb) .  He is clinically improved on current regimen.  We will continue on Breo.  Control for triggers. Patient does have some lingering cough.  Could be aggravated by ACE inhibitor.  Will change if possible Needs PFT set up  Plan  Patient Instructions  Continue on BREO 1 puff daily , rinse after use  Albuterol inhaler As needed   Continue on Allegra and Flonase daily  Activity as tolerated Discuss with Primary MD that Lisinopril may be aggravating your cough  Follow up with Dr. Marchelle Gearing in 3 months and As needed        Allergic rhinitis Continue on current regimen     Rubye Oaks, NP

## 2021-09-03 DIAGNOSIS — J45909 Unspecified asthma, uncomplicated: Secondary | ICD-10-CM | POA: Insufficient documentation

## 2021-09-03 DIAGNOSIS — J309 Allergic rhinitis, unspecified: Secondary | ICD-10-CM | POA: Insufficient documentation

## 2021-09-03 NOTE — Assessment & Plan Note (Signed)
Continue on current regimen .   

## 2021-09-03 NOTE — Assessment & Plan Note (Addendum)
Mild persistent asthma-exhaled nitric oxide testing was elevated last visit.  Has substantially decreased after starting Breo and finishing up a steroid taper.  Today exhaled nitric oxide testing was at 9 ppb (decreased from 90 ppb) .  He is clinically improved on current regimen.  We will continue on Breo.  Control for triggers. Patient does have some lingering cough.  Could be aggravated by ACE inhibitor.  Will change if possible Needs PFT set up  Plan  Patient Instructions  Continue on BREO 1 puff daily , rinse after use  Albuterol inhaler As needed   Continue on Allegra and Flonase daily  Activity as tolerated Discuss with Primary MD that Lisinopril may be aggravating your cough  Follow up with Jeffrey English in 3 months and As needed

## 2021-11-04 ENCOUNTER — Ambulatory Visit (INDEPENDENT_AMBULATORY_CARE_PROVIDER_SITE_OTHER): Payer: BC Managed Care – PPO | Admitting: Internal Medicine

## 2021-11-04 DIAGNOSIS — R053 Chronic cough: Secondary | ICD-10-CM | POA: Diagnosis not present

## 2021-11-04 DIAGNOSIS — Z8709 Personal history of other diseases of the respiratory system: Secondary | ICD-10-CM

## 2021-11-04 LAB — PULMONARY FUNCTION TEST
DL/VA % pred: 142 %
DL/VA: 6.49 ml/min/mmHg/L
DLCO cor % pred: 120 %
DLCO cor: 39.23 ml/min/mmHg
DLCO unc % pred: 120 %
DLCO unc: 39.23 ml/min/mmHg
FEF 25-75 Post: 3.44 L/sec
FEF 25-75 Pre: 2.64 L/sec
FEF2575-%Change-Post: 29 %
FEF2575-%Pred-Post: 85 %
FEF2575-%Pred-Pre: 65 %
FEV1-%Change-Post: 8 %
FEV1-%Pred-Post: 74 %
FEV1-%Pred-Pre: 68 %
FEV1-Post: 3.28 L
FEV1-Pre: 3.03 L
FEV1FVC-%Change-Post: 1 %
FEV1FVC-%Pred-Pre: 99 %
FEV6-%Change-Post: 6 %
FEV6-%Pred-Post: 75 %
FEV6-%Pred-Pre: 70 %
FEV6-Post: 4.09 L
FEV6-Pre: 3.84 L
FEV6FVC-%Pred-Post: 102 %
FEV6FVC-%Pred-Pre: 102 %
FVC-%Change-Post: 6 %
FVC-%Pred-Post: 73 %
FVC-%Pred-Pre: 68 %
FVC-Post: 4.09 L
FVC-Pre: 3.84 L
Post FEV1/FVC ratio: 80 %
Post FEV6/FVC ratio: 100 %
Pre FEV1/FVC ratio: 79 %
Pre FEV6/FVC Ratio: 100 %
RV % pred: 85 %
RV: 1.72 L
TLC % pred: 81 %
TLC: 5.97 L

## 2021-11-04 NOTE — Progress Notes (Signed)
Full PFT performed today. °

## 2021-11-04 NOTE — Patient Instructions (Signed)
Full PFT performed today. °

## 2021-11-05 ENCOUNTER — Ambulatory Visit (INDEPENDENT_AMBULATORY_CARE_PROVIDER_SITE_OTHER): Payer: BC Managed Care – PPO | Admitting: Psychology

## 2021-11-05 DIAGNOSIS — F4323 Adjustment disorder with mixed anxiety and depressed mood: Secondary | ICD-10-CM

## 2021-11-05 NOTE — Progress Notes (Signed)
[facility] Counselor Initial Adult Exam  Name: Jeffrey English Date: 11/05/2021 MRN: [id number] DOB: May 10, 1980 PCP: Jeffrey Arabian, MD  Time Spent: 3:00  pm - 4:00 pm: 60 Minutes   Jeffrey English participated from home, via video, and consented to treatment. Therapist participated from home office. We met online due to Center pandemic.   Guardian/Payee:  N/A    Paperwork requested: No   Reason for Visit /Presenting Problem: Anxiety, family conflict  Mental Status Exam: Appearance:   Casual     Behavior:  Appropriate  Motor:  Normal  Speech/Language:   Clear and Coherent  Affect:  Appropriate  Mood:  anxious  Thought process:  normal  Thought content:    WNL  Sensory/Perceptual disturbances:    WNL  Orientation:  oriented to person, place, and situation  Attention:  Good  Concentration:  Good  Memory:  WNL  Fund of knowledge:   Good  Insight:    Good  Judgment:   Good  Impulse Control:  Good    Reported Symptoms:  Worry, anxiety, thought perseveration  Risk Assessment: Danger to Self:  No Self-injurious Behavior: No Danger to Others: No Duty to Warn: N/A Physical Aggression / Violence:No  Access to Firearms a concern:  unknown Gang Involvement:No  Patient / guardian was educated about steps to take if suicide or homicide risk level increases between visits: n/a While future psychiatric events cannot be accurately predicted, the patient does not currently require acute inpatient psychiatric care and does not currently meet [facility] involuntary commitment criteria.  Substance Abuse History: Current substance abuse: No     Past Psychiatric History:   No previous psychological problems have been observed Outpatient Providers:Jeffrey English, Ph.D. History of Psych Hospitalization: No  Psychological Testing:  none    Abuse History:  Victim of: No.,  N/A    Report needed: No. Victim of Neglect:No. Perpetrator of   N/A   Witness / Exposure to Domestic Violence: No   Protective Services Involvement: No  Witness to [employer] Violence:  No   Family History:  Family History  Problem Relation Age of Onset   Cancer Paternal Uncle 65       lung cancer    Living situation: the patient lives with their spouse  Sexual Orientation: Straight  Relationship Status: married  Name of spouse / other:Jeffrey English If a parent, number of children / ages:2  Support Systems: spouse  Museum/gallery curator Stress:  No   Income/Employment/Disability: Employment  Armed forces logistics/support/administrative officer: No   Educational History: Education: Scientist, product/process development: unknown  Any cultural differences that may affect / interfere with treatment:  not applicable   Recreation/Hobbies: tennis  Stressors: Marital or family conflict    Strengths: Supportive Relationships  Barriers:  not identified   Legal History: Pending legal issue / charges: The patient has no significant history of legal issues. History of legal issue / charges:  N/A  Medical History/Surgical History: reviewed Past Medical History:  Diagnosis Date   ADHD    Anxiety    Hypertension     Past Surgical History:  Procedure Laterality Date   HEMORROIDECTOMY  2014   INGUINAL HERNIA REPAIR Left 06/27/2019   Procedure: LAPAROSCOPIC LEFT INGUINAL HERNIA REPAIR WITH MESH;  Surgeon: Coralie Keens, MD;  Location: [facility];  Service: General;  Laterality: Left;   NASAL SEPTOPLASTY W/ TURBINOPLASTY Bilateral 01/16/2020   Procedure:  NASAL SEPTOPLASTY WITH TURBINATE REDUCTION;  Surgeon: Jerrell Belfast, MD;  Location: [facility];  Service: ENT;  Laterality: Bilateral;    Medications: Current Outpatient Medications  Medication Sig Dispense Refill   albuterol (VENTOLIN HFA) 108 (90 Base) MCG/ACT inhaler SMARTSIG:By Mouth     ALPRAZolam (XANAX) 0.5 MG tablet Take 0.5 mg by mouth daily as needed for anxiety.      amphetamine-dextroamphetamine (ADDERALL XR) 20 MG 24 hr capsule Take 20 mg by mouth every morning.     amphetamine-dextroamphetamine (ADDERALL) 20 MG tablet Take 20 mg by mouth daily.     Cholecalciferol (VITAMIN D3) 75 MCG (3000 UT) TABS Take 3,000 Units by mouth daily.     fexofenadine (ALLEGRA) 180 MG tablet Take 180 mg by mouth daily.     fluticasone (FLONASE) 50 MCG/ACT nasal spray instill 1 spray into each nostril EACH NIGHT  0   fluticasone furoate-vilanterol (BREO ELLIPTA) 200-25 MCG/ACT AEPB Inhale 1 puff into the lungs daily. 60 each 5   lisinopril (ZESTRIL) 20 MG tablet Take 1 tablet by mouth daily.     omeprazole (PRILOSEC) 40 MG capsule Take 40 mg by mouth as needed.  0   testosterone cypionate (DEPO-TESTOSTERONE) 200 MG/ML injection Inject 0.5 mLs (100 mg total) into the muscle every 7 (seven) days. 10 mL 1   Current Facility-Administered Medications  Medication Dose Route Frequency Provider Last Rate Last Admin   testosterone cypionate (DEPOTESTOSTERONE CYPIONATE) injection 100 mg  100 mg Intramuscular Once Anastasio Champion, Nimish C, MD        Allergies  Allergen Reactions   Clonazepam Other (See Comments)   Lorazepam Other (See Comments)  Initial session: Jeffrey English expressed his desire to seek some consultation about the circumstances in his family of origin.His wife Jeffrey English has pointed out to him that he is distracted and appears angry at times.  He had a conversation with his dad last Thursday and he is concerned that his mom will inquire. He feels confused about his mother's responses. He also fears that his mother's reaction to his father down the road may be complicated and negative. He says he can try to bring his dad to the table as he is regarded as "the doc" in the family. The living situation for his parents has been "super weird" in that their conversations seem reasonably normal and not hostile. This is contrary to what is actually happening in the marriage.  Jeffrey English has a number  of concerns on several levels. 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 His father has told him he will turn over his half of the business to [place] and that his sisters will get some % as well. That has not been written or discussed in the family or with his sibs. His mother has given assurances to him that she will turn her ownership over to the kids, but did not discuss percents. He also has concerns about the future of the family and how the divorce will impact relationships. Is very unsure how his father will handle it and if he will rely more heavily on the kids. Finally, he shared concerns about how the grandchildren (his kids and Lina's children) will handle the divorce. He is often caught in the middle between his parents. Discussed how to stay out of the middle and create and manage boundaries. Will set up additional individual sessions to discuss and he will attend his mother's family sessions.  Goals" Jeffrey English is seeking counseling  to learn best strategies to manage his anxiety and worry associated with the impending separation and divorce of his parents. Cognitive interventions along with insight oriented therapy will be employed to achieve symptom reduction and practical behavioral strategies to navigate the changing family landscape. Goal Date is 12-23.     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

## 2021-11-11 ENCOUNTER — Ambulatory Visit: Payer: BC Managed Care – PPO | Admitting: Internal Medicine

## 2021-11-11 ENCOUNTER — Encounter: Payer: Self-pay | Admitting: Internal Medicine

## 2021-11-11 VITALS — BP 124/76 | HR 80 | Temp 98.1°F | Ht 72.0 in | Wt 231.4 lb

## 2021-11-11 DIAGNOSIS — J4541 Moderate persistent asthma with (acute) exacerbation: Secondary | ICD-10-CM | POA: Diagnosis not present

## 2021-11-11 DIAGNOSIS — D721 Eosinophilia, unspecified: Secondary | ICD-10-CM | POA: Diagnosis not present

## 2021-11-11 DIAGNOSIS — R768 Other specified abnormal immunological findings in serum: Secondary | ICD-10-CM

## 2021-11-11 DIAGNOSIS — Z9109 Other allergy status, other than to drugs and biological substances: Secondary | ICD-10-CM

## 2021-11-11 MED ORDER — MONTELUKAST SODIUM 10 MG PO TABS: 10.0000 mg | ORAL_TABLET | Freq: Every day | ORAL | 11 refills | Status: DC

## 2021-11-11 MED ORDER — PREDNISONE 10 MG PO TABS: ORAL_TABLET | ORAL | 0 refills | Status: AC

## 2021-11-11 NOTE — Progress Notes (Signed)
OV 08/09/2021  Subjective:  Patient ID: Claretta Fraise, male , DOB: March 28, 1980 , age 41 y.o. , MRN: [id number] , ADDRESS: [place] [id number] PCP Gaynelle Arabian, MD Patient Care Team: Gaynelle Arabian, MD as PCP - General (Family Medicine)  This Provider for this visit: Treatment Team:  Attending Provider: Margaretha Seeds, MD    08/09/2021 -   Chief Complaint  Patient presents with   Consult    Pt states that he has been having complaints of wheezing as well as SOB which began 2 months ago. States that he did have asthma a a child. Pt also has had a lingering cough off and on for the past 2 years.     HPI Arbor Cohen 41 y.o. 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  He tells me that he had asthma as a child and then outgrew.  In the past he is also had deviated nasal septum and ENT surgery for which a lot of mucus was cleaned out by Dr. Wilburn Cornelia.  At that time antibiotic help.  Has been on Flonase.  He said he had COVID 2 years ago and then after that for the last 2 years he has had intermittent low-grade chronic cough.  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  Then in April 2023 sudden degeneration into chronic cough with congestion and wheezing.  Symptoms are present at night.  I believe he took some treatment and then he started feeling better.  Then in May 2023 he went to [place] with his wife and that the cough got worse.  He started using his albuterol more.  This seemed to help.  His symptoms particularly worse at night.  Then he came back to the [place] after his vacation.  Around Jun 29, 2021 he went to urgent care.  He was found to be wheezing he was given 5-day prednisone also given doxycycline.  He says this helped but not fully.  However he has been able to play tennis.  Then this past week he feels his symptoms have come back particularly similar to the episode in April.  His a lot of cough is wheezing.  Symptoms are present at night.  Yet  in the daytime is able to play tennis.  This particular week has been bad with a lot of congestion throat pain cough wheezing.  Most symptoms are at night.  Albuterol is helping but only somewhat.  No fever    CT Chest data November 2020:-coronary calcium CT November 2020: Calcium score 0.  No pulmonary lung abnormalities.  [Personally visualized]  Chest x-ray: August 2022 done for cough was clear.  [Personally visualized the image]  Noted to be on testosterone for the last 1 year.  Monitors his PSA.     09/02/21 Follow up : Asthma  Patient returns for a 1 month follow-up.  Last visit presented for ongoing cough wheezing and shortness of breath.  Patient says he had increased symptoms since having COVID-19 in 2021.  He has had ongoing daily cough.  Last visit he was treated with a prednisone taper.  Started on Breo 200 once daily.  Exhaled nitric oxide testing was elevated at 90ppb.  Labs showed an elevated IgE, strongly positive dust mite allergens.  And mildly positive cat and dog dander.  Absolute eosinophil count was 300 D-dimer was negative.  Chest x-ray showed no acute process.  Some mild peribronchial thickening. Since last visit patient is feeling much better.  Cough and wheezing are substantially improved.  Patient continues to have some lingering dry cough.  Patient is on ACE inhibitor.  Patient also has chronic allergies.  Is on Allegra and Flonase.  PFTs were ordered but have not been scheduled yet.  He denies any fever, discolored mucus chest pain orthopnea PND or increased leg swelling         OV 11/11/2021  Subjective:  Patient ID: [name], male , DOB: 1981/01/22 , age 43 y.o. , MRN: [id number] , ADDRESS: [address] [place] [facility] [address] PCP Blair Heys, MD Patient Care Team: Blair Heys, MD as PCP - General (Family Medicine)  This Provider for this visit: Treatment Team:  Attending Provider: Kalman Shan, MD    11/11/2021 -   Chief Complaint   Patient presents with   Follow-up    Pt states he started wheezing at night a couple weeks ago and also had congestion in chest.     HPI Schyler Falk 41 y.o. -returns for follow-up.  He says after he saw me he started the [facility] took a short course of oral prednisone and was doing really well.  His results show that he does have elevated IgE and also elevated eosinophils and strongly positive RAST allergy panel to house dust mite.  However he says in the last few weeks he is starting to get symptomatic again.  He does start having wheezing intermittently although he is not waking up from it.  No chest tightness.  No cough.  He is still able to play tennis quite well.  He did have cough but that resolved after he stopped his lisinopril.  He feels he is sleeping back although is not as worse as he was 1 year ago.  He did pulmonary function test on November 04, 2021 and this shows resstriction with high DLCO.  His spirometry is not classic for asthma but he did have +8% bronchodilator response.  His DLCO is elevated and his consistent with asthma findings.     PFT     Latest Ref Rng & Units 11/04/2021   11:48 AM  PFT Results  FVC-Pre L 3.84   FVC-Predicted Pre % 68   FVC-Post L 4.09   FVC-Predicted Post % 73   Pre FEV1/FVC % % 79   Post FEV1/FCV % % 80   FEV1-Pre L 3.03   FEV1-Predicted Pre % 68   FEV1-Post L 3.28   DLCO uncorrected ml/min/mmHg 39.23   DLCO UNC% % 120   DLCO corrected ml/min/mmHg 39.23   DLCO COR %Predicted % 120   DLVA Predicted % 142   TLC L 5.97   TLC % Predicted % 81   RV % Predicted % 85        has a past medical history of ADHD, Anxiety, and Hypertension.   reports that he quit smoking about 13 years ago. His smoking use included cigarettes. He has never used smokeless tobacco.  Past Surgical History:  Procedure Laterality Date   HEMORROIDECTOMY  2014   INGUINAL HERNIA REPAIR Left 06/27/2019   Procedure: LAPAROSCOPIC LEFT INGUINAL HERNIA REPAIR WITH  MESH;  Surgeon: [name], MD;  Location: [facility];  Service: General;  Laterality: Left;   NASAL SEPTOPLASTY W/ TURBINOPLASTY Bilateral 01/16/2020   Procedure: NASAL SEPTOPLASTY WITH TURBINATE REDUCTION;  Surgeon: Osborn Coho, MD;  [facility];  Service: ENT;  Laterality: Bilateral;    Allergies  Allergen Reactions   Clonazepam Other (See Comments)  Lorazepam Other (See Comments)     There is no immunization history on file for this patient.  Family History  Problem Relation Age of Onset   Cancer Paternal Uncle 62       lung cancer     Current Outpatient Medications:    albuterol (VENTOLIN HFA) 108 (90 Base) MCG/ACT inhaler, SMARTSIG:By Mouth, Disp: , Rfl:    ALPRAZolam (XANAX) 0.5 MG tablet, Take 0.5 mg by mouth daily as needed for anxiety., Disp: , Rfl:    amLODipine (NORVASC) 5 MG tablet, Take 5 mg by mouth daily., Disp: , Rfl:    amphetamine-dextroamphetamine (ADDERALL XR) 20 MG 24 hr capsule, Take 20 mg by mouth every morning., Disp: , Rfl:    amphetamine-dextroamphetamine (ADDERALL) 20 MG tablet, Take 20 mg by mouth daily., Disp: , Rfl:    Cholecalciferol (VITAMIN D3) 75 MCG (3000 UT) TABS, Take 3,000 Units by mouth daily., Disp: , Rfl:    fexofenadine (ALLEGRA) 180 MG tablet, Take 180 mg by mouth daily., Disp: , Rfl:    fluticasone (FLONASE) 50 MCG/ACT nasal spray, instill 1 spray into each nostril EACH NIGHT, Disp: , Rfl: 0   fluticasone furoate-vilanterol (BREO ELLIPTA) 200-25 MCG/ACT AEPB, Inhale 1 puff into the lungs daily., Disp: 60 each, Rfl: 5   omeprazole (PRILOSEC) 40 MG capsule, Take 40 mg by mouth as needed., Disp: , Rfl: 0   testosterone cypionate (DEPO-TESTOSTERONE) 200 MG/ML injection, Inject 0.5 mLs (100 mg total) into the muscle every 7 (seven) days., Disp: 10 mL, Rfl: 1  Current Facility-Administered Medications:    testosterone cypionate (DEPOTESTOSTERONE CYPIONATE) injection 100 mg, 100 mg,  Intramuscular, Once, Gosrani, Nimish C, MD      Objective:   Vitals:   11/11/21 1531  BP: 124/76  Pulse: 80  Temp: 98.1 F (36.7 C)  TempSrc: Oral  SpO2: 100%  Weight: 231 lb 6.4 oz (105 kg)  Height: 6' (1.829 m)    Estimated body mass index is 31.38 kg/m as calculated from the following:   Height as of this encounter: 6' (1.829 m).   Weight as of this encounter: 231 lb 6.4 oz (105 kg).  @WEIGHTCHANGE @    11/11/21 1531  Weight: 231 lb 6.4 oz (105 kg)     Physical Exam    General: No distress. Looks well Neuro: Alert and Oriented x 3. GCS 15. Speech normal Psych: Pleasant Resp:  Barrel Chest - no.  Wheeze - L > R Wheeze, Crackles - no, No overt respiratory distress CVS: Normal heart sounds. Murmurs - no Ext: Stigmata of Connective Tissue Disease - no HEENT: Normal upper airway. PEERL +. No post nasal drip        Assessment:       ICD-10-CM   1. Moderate persistent asthma with acute exacerbation  J45.41     2. House dust mite allergy  Z91.09     3. Eosinophilia, unspecified type  D72.10     4. Elevated IgE level  R76.8      We discussed ongoing symptoms with asthma.  Particularly still has wheezing.  He has type II eosinophilic/allergic phenotype.  We discussed starting Singulair.  He is agreeable to this.  Given his FEV1 being 68% discussed the need to start biologic therapy.  We went over Dupixent and 01/11/22 is 2 different options.  We have settled for Dupixent.  Also discussed mitigation measures he could take for house dust mite exposure.    Plan:     Patient Instructions  Moderate persistent asthma with acute exacerbation House dust mite allergy Eosinophilia, unspecified type Elevated IgE level   Asthma flaring again Allergic component present esp dust mite allergy Asthma is at least moderately severe based on pulmonary function testing indicating that you need more aggressive therapy  Plan -Start Dupixent high-dose -we will  start the paperwork  -Start montelukast 10 mg once daily at night - Please take prednisone 40 mg x1 day, then 30 mg x1 day, then 20 mg x1 day, then 10 mg x1 day, and then 5 mg x1 day and stop -Continue Breo high-dose as before scheduled with albuterol as needed -Continue Flonase -Continue Allegra -Work on medication with house dust mite situation -we went over several steps  Follow-up - 6-8 weeks with Dr. Marchelle Gearing or nurse practitioner  -If you are feeling worse call as soon     SIGNATURE    Dr. Kalman Shan, M.D., F.C.C.P,  Pulmonary and Critical Care Medicine Staff Physician, [facility] Center Director - Interstitial Lung Disease  Program  Pulmonary Fibrosis [facility] at [facility] [place], [place], [address]  Pager: [phone], If no answer or between  15:00h - 7:00h: call [phone] Telephone: [phone]  4:05 PM 11/11/2021

## 2021-11-11 NOTE — Patient Instructions (Addendum)
Moderate persistent asthma with acute exacerbation House dust mite allergy Eosinophilia, unspecified type Elevated IgE level   Asthma flaring again Allergic component present esp dust mite allergy Asthma is at least moderately severe based on pulmonary function testing indicating that you need more aggressive therapy  Plan -Start Dupixent high-dose -we will start the paperwork  -Start montelukast 10 mg once daily at night - Please take prednisone 40 mg x1 day, then 30 mg x1 day, then 20 mg x1 day, then 10 mg x1 day, and then 5 mg x1 day and stop -Continue Breo high-dose as before scheduled with albuterol as needed -Continue Flonase -Continue Allegra -Work on medication with house dust mite situation -we went over several steps  Follow-up - 6-8 weeks with Dr. Chase Caller or nurse practitioner  -If you are feeling worse call as soon

## 2021-11-15 ENCOUNTER — Telehealth: Payer: Self-pay

## 2021-11-15 ENCOUNTER — Other Ambulatory Visit (HOSPITAL_COMMUNITY): Payer: Self-pay

## 2021-11-15 NOTE — Telephone Encounter (Signed)
Patient Advocate Encounter   Received notification that prior authorization is required for Dupixent 300MG /2ML syringes  Submitted: 11-15-2021 Key [id number]  Status is pending

## 2021-11-18 ENCOUNTER — Telehealth: Payer: Self-pay

## 2021-11-18 ENCOUNTER — Other Ambulatory Visit (HOSPITAL_COMMUNITY): Payer: Self-pay

## 2021-11-18 NOTE — Telephone Encounter (Signed)
A user error has taken place: encounter opened in error, closed for administrative reasons.

## 2021-11-18 NOTE — Telephone Encounter (Signed)
Be advised of previous information. Copy of copay card has also been sent to scan center. R[facility] and to schedule New Start visit.

## 2021-11-18 NOTE — Telephone Encounter (Signed)
Received notification from [facility] regarding a prior authorization for [place]. Authorization has been APPROVED from 11/17/2021 to 05/19/2022.   Approval letter has been attached to patients documents.

## 2021-11-18 NOTE — Telephone Encounter (Signed)
Dupixent My Way Copay Card  RxBin: [id number] RxPCN: Loyalty RxGRP: [id number] ID: [id number]

## 2021-11-18 NOTE — Telephone Encounter (Signed)
Patient scheduled for Dupixent new start on 11/19/21. Will use sample  Knox Saliva, PharmD, MPH, BCPS, CPP Clinical Pharmacist (Rheumatology and Pulmonology)

## 2021-11-18 NOTE — Telephone Encounter (Addendum)
Previous encounter was accidentally closed prematurely--this is a continuation of the PA encounter.  Received notification from [facility] regarding a prior authorization for [place]. Authorization has been APPROVED from 11/17/2021 to 05/19/2022.   Patient must fill through [facility]: [phone]  Authorization # [id number]   Will work on enrollment into copay card program. Completed Dupixent MyWay application has been sent to scan center for retention.

## 2021-11-19 ENCOUNTER — Other Ambulatory Visit: Payer: BC Managed Care – PPO | Admitting: Pharmacist

## 2021-11-19 ENCOUNTER — Ambulatory Visit: Payer: BC Managed Care – PPO | Admitting: Pharmacist

## 2021-11-19 DIAGNOSIS — D721 Eosinophilia, unspecified: Secondary | ICD-10-CM

## 2021-11-19 DIAGNOSIS — J4541 Moderate persistent asthma with (acute) exacerbation: Secondary | ICD-10-CM

## 2021-11-19 DIAGNOSIS — Z7189 Other specified counseling: Secondary | ICD-10-CM

## 2021-11-19 MED ORDER — DUPIXENT 300 MG/2ML ~~LOC~~ SOAJ: 300.0000 mg | SUBCUTANEOUS | 1 refills | Status: DC

## 2021-11-19 NOTE — Progress Notes (Addendum)
HPI Patient presents today to [facility] to see pharmacy team for Dupixent new start.  Past medical history includes moderate persistent asthma, allergic rhinitis. States he hasn't seen dermatologist but does get patches on the skin - he saw that Dupixent is approved for atopic dermatitis.  He saw Dr. Marchelle Gearing on 11/11/21. He was started on montelukast 10mg  nightly. Also prescribed prednisone taper. Has received two prednisone tapers in the past 5 months. No hospitalizations or ED visits. He states he is feeling better since finishing most recent prednisone taper.  He does self-inject Depo-testosterone with vial/syringe so feels comfortable with injections in general.  Respiratory Medications Current regimen: Breo Ellipta 200-25 mcg (1 puff once daily), montelukast 10mg  nightly  Patient reports no known adherence challenges  OBJECTIVE Allergies  Allergen Reactions   Clonazepam Other (See Comments)   Lorazepam Other (See Comments)    Outpatient Encounter Medications as of 11/19/2021  Medication Sig Note   albuterol (VENTOLIN HFA) 108 (90 Base) MCG/ACT inhaler SMARTSIG:By Mouth    ALPRAZolam (XANAX) 0.5 MG tablet Take 0.5 mg by mouth daily as needed for anxiety.    amLODipine (NORVASC) 5 MG tablet Take 5 mg by mouth daily.    amphetamine-dextroamphetamine (ADDERALL XR) 20 MG 24 hr capsule Take 20 mg by mouth every morning.    amphetamine-dextroamphetamine (ADDERALL) 20 MG tablet Take 20 mg by mouth daily.    Cholecalciferol (VITAMIN D3) 75 MCG (3000 UT) TABS Take 3,000 Units by mouth daily.    fexofenadine (ALLEGRA) 180 MG tablet Take 180 mg by mouth daily.    fluticasone (FLONASE) 50 MCG/ACT nasal spray instill 1 spray into each nostril EACH NIGHT 07/10/2015: Received from: External Pharmacy   fluticasone furoate-vilanterol (BREO ELLIPTA) 200-25 MCG/ACT AEPB Inhale 1 puff into the lungs daily.    montelukast (SINGULAIR) 10 MG tablet Take 1 tablet (10 mg total) by mouth at  bedtime.    omeprazole (PRILOSEC) 40 MG capsule Take 40 mg by mouth as needed. 07/10/2015: Received from: External Pharmacy   testosterone cypionate (DEPO-TESTOSTERONE) 200 MG/ML injection Inject 0.5 mLs (100 mg total) into the muscle every 7 (seven) days.    Facility-Administered Encounter Medications as of 11/19/2021  Medication   testosterone cypionate (DEPOTESTOSTERONE CYPIONATE) injection 100 mg      There is no immunization history on file for this patient.   PFTs    Latest Ref Rng & Units 11/04/2021   11:48 AM  PFT Results  FVC-Pre L 3.84   FVC-Predicted Pre % 68   FVC-Post L 4.09   FVC-Predicted Post % 73   Pre FEV1/FVC % % 79   Post FEV1/FCV % % 80   FEV1-Pre L 3.03   FEV1-Predicted Pre % 68   FEV1-Post L 3.28   DLCO uncorrected ml/min/mmHg 39.23   DLCO UNC% % 120   DLCO corrected ml/min/mmHg 39.23   DLCO COR %Predicted % 120   DLVA Predicted % 142   TLC L 5.97   TLC % Predicted % 81   RV % Predicted % 85      Eosinophils Most recent blood eosinophil count was 300 cells/microL taken on 08/09/2021.   IgE: 546 on 08/09/2021  Assessment   Biologics training for dupilumab (Dupixent)  Goals of therapy: Mechanism: human monoclonal IgG4 antibody that inhibits interleukin-4 and interleukin-13 cytokine-induced responses, including release of proinflammatory cytokines, chemokines, and IgE Reviewed that Dupixent is add-on medication and patient must continue maintenance inhaler regimen. Response to therapy: may take 4 months to determine efficacy.  Discussed that patients generally feel improvement sooner than 4 months.  Side effects: injection site reaction (6-18%),  ophthalmic conjunctivitis (2-16%)  Dose: 600mg  at Week 0 (administered today in clinic) followed by 300mg  every 14 days thereafter  Administration/Storage:  Reviewed administration sites of thigh or abdomen (at least 2-3 inches away from abdomen). Reviewed the upper arm is only appropriate if caregiver is  administering injection  Do not shake pen/syringe as this could lead to product foaming or precipitation. Do not use if solution is discolored or contains particulate matter or if window on prefilled pen is yellow (indicates pen has been used).  Reviewed storage of medication in refrigerator. Reviewed that [place] can be stored at room temperature in unopened carton for up to 14 days.  Access: Approval of Dupixent through: insurance Patient enrolled into copay card program to help with copay assistance.  Patient self-administered Dupixent 300mg /21ml x 2 (total dose 600mg ) in right lower abdomen and left lower abdomen using sample Dupixent 300mg /70mL autoinjector pen NDC: [id number] Lot: [id number] Expiration: 12/11/2023  Patient monitored for 30 minutes for adverse reaction.  Patient tolerated without issue. Injection site checked and no redness or swelling noted by provider. Patient reports slight itchiness at injection site but no irritation. Itchiness is not bothersome. We reviewed injection site reaction management in person and have written on AVS as well for future reference  Medication Reconciliation  A drug regimen assessment was performed, including review of allergies, interactions, disease-state management, dosing and immunization history. Medications were reviewed with the patient, including name, instructions, indication, goals of therapy, potential side effects, importance of adherence, and safe use.  Drug interaction(s): none noted   PLAN Continue Dupixent 300mg  subcut every 14 days.  Next dose is due 12/03/21 and every 14 days thereafter. Rx sent to: [facility]: [id number].  Patient provided with pharmacy phone number and advised to call later this week to schedule shipment to home. Patient provided with copay card information to provide to pharmacy. Continue maintenance asthma regimen of: Breo Ellipta 200-25 mcg (1 puff once daily), montelukast 10mg   nightly  All questions encouraged and answered.  Instructed patient to reach out with any further questions or concerns.  Thank you for allowing pharmacy to participate in this patient's care.  This appointment required 45 minutes of patient care (this includes precharting, chart review, review of results, face-to-face care, etc.).  Knox Saliva, PharmD, MPH, BCPS, CPP Clinical Pharmacist (Rheumatology and Pulmonology)

## 2021-11-19 NOTE — Patient Instructions (Addendum)
Your next [place] dose is due on 12/03/21, 12/17/21, and every 14 days thereafter  [place] and montelukast  Your prescription will be shipped from [facility]. Their phone number is [phone] Please call to schedule shipment and confirm address. They will mail your medication to your home.  Your copay should be affordable. If you call the pharmacy and it is not affordable, please double-check that they are billing through your copay card as secondary coverage. That copay card information is: RxBin: [id number] RxPCN: [id number] RxGROUP: [id number] ID: [id number]  You will need to be seen by your provider in 3 to 4 months to assess how [place] is working for you. Please ensure you have a follow-up appointment scheduled in January or February 2024. Call our clinic if you need to make this appointment.  How to manage an injection site reaction: Remember the 5 C's: COUNTER - leave on the counter at least 30 minutes but up to overnight to bring medication to room temperature. This may help prevent stinging COLD - place something cold (like an ice gel pack or cold water bottle) on the injection site just before cleansing with alcohol. This may help reduce pain CLARITIN - use Claritin (generic name is loratadine) for the first two weeks of treatment or the day of, the day before, and the day after injecting. This will help to minimize injection site reactions CORTISONE CREAM - apply if injection site is irritated and itching CALL ME - if injection site reaction is bigger than the size of your fist, looks infected, blisters, or if you develop hives

## 2021-11-20 ENCOUNTER — Ambulatory Visit: Payer: BC Managed Care – PPO | Admitting: Psychology

## 2021-11-20 DIAGNOSIS — F4323 Adjustment disorder with mixed anxiety and depressed mood: Secondary | ICD-10-CM | POA: Diagnosis not present

## 2021-11-20 NOTE — Progress Notes (Signed)
[facility] Counsel[facility] Initial Adult Exam  Name: Kem Hensen Date: 11/20/2021 MRN: [id number] DOB: [birth year] PCP: Gaynelle Arabian, MD     Claretta Fraise participated from home, via video, and consented to treatment. Therapist participated from home office. We met online due to [place] pandemic.   Guardian/Payee:  N/A    Paperwork requested: No   Reason for Visit /Presenting Problem: Anxiety, family conflict  Mental Status Exam: Appearance:   Casual     Behavior:  Appropriate  Motor:  Normal  Speech/Language:   Clear and Coherent  Affect:  Appropriate  Mood:  anxious  Thought process:  normal  Thought content:    WNL  Sensory/Perceptual disturbances:    WNL  Orientation:  oriented to person, place, and situation  Attention:  Good  Concentration:  Good  Memory:  WNL  Fund of knowledge:   Good  Insight:    Good  Judgment:   Good  Impulse Control:  Good    Reported Symptoms:  Worry, anxiety, thought perseveration  Risk Assessment: Danger to Self:  No Self-injurious Behavior: No Danger to Others: No Duty to Warn: N/A Physical Aggression / Violence:No  Access to Firearms a concern:  unknown Gang Involvement:No  Patient / guardian was educated about steps to take if suicide or homicide risk level increases between visits: n/a While future psychiatric events cannot be accurately predicted, the patient does not currently require acute inpatient psychiatric care and does not currently meet [facility] involuntary commitment criteria.  Substance Abuse History: Current substance abuse: No     Past Psychiatric History:   No previous psychological problems have been observed Outpatient Providers:Jacy Brocker Hutterman, Ph.D. History of Psych Hospitalization: No  Psychological Testing:  none    Abuse History:  Victim of: No.,  N/A    Report needed: No. Victim of Neglect:No. Perpetrator of  N/A   Witness / Exposure to Domestic Violence: No   Protective  Services Involvement: No  Witness to [employer] Violence:  No   Family History:  Family History  Problem Relation Age of Onset   Cancer Paternal Uncle 72       lung cancer    Living situation: the patient lives with their spouse  Sexual Orientation: Straight  Relationship Status: married  Name of spouse / other:[name] If a parent, number of children / ages:2  Support Systems: spouse  Museum/gallery curator Stress:  No   Income/Employment/Disability: Employment  Armed forces logistics/support/administrative officer: No   Educational History: Education: Scientist, product/process development: unknown  Any cultural differences that may affect / interfere with treatment:  not applicable   Recreation/Hobbies: tennis  Stressors: Marital or family conflict    Strengths: Supportive Relationships  Barriers:  not identified   Legal History: Pending legal issue / charges: The patient has no significant history of legal issues. History of legal issue / charges:  N/A  Medical History/Surgical History: reviewed Past Medical History:  Diagnosis Date   ADHD    Anxiety    Hypertension     Past Surgical History:  Procedure Laterality Date   HEMORROIDECTOMY  2014   INGUINAL HERNIA REPAIR Left 06/27/2019   Procedure: LAPAROSCOPIC LEFT INGUINAL HERNIA REPAIR WITH MESH;  Surgeon: Coralie Keens, MD;  Location: [facility];  Service: General;  Laterality: Left;   NASAL SEPTOPLASTY W/ TURBINOPLASTY Bilateral 01/16/2020   Procedure: NASAL SEPTOPLASTY WITH TURBINATE REDUCTION;  Surgeon: Jerrell Belfast, MD;  Location: [facility];  Service: ENT;  Laterality: Bilateral;  Medications: Current Outpatient Medications  Medication Sig Dispense Refill   albuterol (VENTOLIN HFA) 108 (90 Base) MCG/ACT inhaler SMARTSIG:By Mouth     ALPRAZolam (XANAX) 0.5 MG tablet Take 0.5 mg by mouth daily as needed for anxiety.     amLODipine (NORVASC) 5 MG tablet Take 5 mg by mouth daily.      amphetamine-dextroamphetamine (ADDERALL XR) 20 MG 24 hr capsule Take 20 mg by mouth every morning.     amphetamine-dextroamphetamine (ADDERALL) 20 MG tablet Take 20 mg by mouth daily.     Cholecalciferol (VITAMIN D3) 75 MCG (3000 UT) TABS Take 3,000 Units by mouth daily.     Dupilumab (DUPIXENT) 300 MG/2ML SOPN Inject 300 mg into the skin every 14 (fourteen) days. Loading dose completed in clinic on 11/19/21 12 mL 1   fexofenadine (ALLEGRA) 180 MG tablet Take 180 mg by mouth daily.     fluticasone (FLONASE) 50 MCG/ACT nasal spray instill 1 spray into each nostril EACH NIGHT  0   fluticasone furoate-vilanterol (BREO ELLIPTA) 200-25 MCG/ACT AEPB Inhale 1 puff into the lungs daily. 60 each 5   montelukast (SINGULAIR) 10 MG tablet Take 1 tablet (10 mg total) by mouth at bedtime. 30 tablet 11   omeprazole (PRILOSEC) 40 MG capsule Take 40 mg by mouth as needed.  0   testosterone cypionate (DEPO-TESTOSTERONE) 200 MG/ML injection Inject 0.5 mLs (100 mg total) into the muscle every 7 (seven) days. 10 mL 1   Current Facility-Administered Medications  Medication Dose Route Frequency Provider Last Rate Last Admin   testosterone cypionate (DEPOTESTOSTERONE CYPIONATE) injection 100 mg  100 mg Intramuscular Once Anastasio Champion, Nimish C, MD        Allergies  Allergen Reactions   Clonazepam Other (See Comments)   Lorazepam Other (See Comments)  Initial session: Laymon expressed his desire to seek some consultation about the circumstances in his family of origin.His wife [name] has pointed out to him that he is distracted and appears angry at times.  He had a conversation with his dad last Thursday and he is concerned that his mom will inquire. He feels confused about his mother's responses. He also fears that his mother's reaction to his father down the road may be complicated and negative. He says he can try to bring his dad to the table as he is regarded as "the doc" in the family. The living situation for his  parents has been "super weird" in that their conversations seem reasonably normal and not hostile. This is contrary to what is actually happening in the marriage.  Tryone has a number of concerns on several levels. 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 His father has told him he will turn over his half of the business to [place] and that his sisters will get some % as well. That has not been written or discussed in the family or with his sibs. His mother has given assurances to him that she will turn her ownership over to the kids, but did not discuss percents. He also has concerns about the future of the family and how the divorce will impact relationships. Is very unsure how his father will handle it and if he will rely more heavily on the kids. Finally, he shared concerns about how the grandchildren (his kids and Lina's children) will handle the divorce. He is often caught in the middle between his parents. Discussed how to stay out of the middle and create and manage boundaries.  Will set up additional individual sessions to discuss and he will attend his mother's family sessions.  Goals" Bobak is seeking counseling to learn best strategies to manage his anxiety and worry associated with the impending separation and divorce of his parents. Cognitive interventions along with insight oriented therapy will be employed to achieve symptom reduction and practical behavioral strategies to navigate the changing family landscape. Goal Date is 12-23.  Session notes: This session is with Stormy and May. She says that her husband told her that Rushi suggested they stay in the same house and still divorce down the road. He also told her he wants to go ahead and get the divorce over with as fast as possible. May is defensive in that she feels that her children are delaying her plan to separate. He wants to have a financial plan before they make decisions and May does not feel it is  necessary.  Goal: Trayson is seeking counseling to better understand his own reaction to his parents separation/divorce. In addition, he wants to learn to be supportive of both parents without "being in the middle". The stress of the situation has created symptoms of both depression and anxiety. Goal date is 5-24.     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 

## 2021-11-28 ENCOUNTER — Ambulatory Visit: Payer: BC Managed Care – PPO | Admitting: Psychology

## 2021-12-23 ENCOUNTER — Encounter: Payer: Self-pay | Admitting: Adult Health

## 2021-12-23 ENCOUNTER — Ambulatory Visit (INDEPENDENT_AMBULATORY_CARE_PROVIDER_SITE_OTHER): Payer: BC Managed Care – PPO | Admitting: Adult Health

## 2021-12-23 VITALS — BP 126/80 | HR 82 | Temp 98.0°F | Ht 73.0 in | Wt 236.2 lb

## 2021-12-23 DIAGNOSIS — J309 Allergic rhinitis, unspecified: Secondary | ICD-10-CM

## 2021-12-23 DIAGNOSIS — J4550 Severe persistent asthma, uncomplicated: Secondary | ICD-10-CM | POA: Diagnosis not present

## 2021-12-23 LAB — POCT EXHALED NITRIC OXIDE: FeNO level (ppb): 20

## 2021-12-23 NOTE — Addendum Note (Signed)
Addended by: Delrae Rend on: 12/23/2021 03:48 PM   Modules accepted: Orders

## 2021-12-23 NOTE — Assessment & Plan Note (Signed)
Continue on current regimen.  Continue with trigger prevention.  Plan  Patient Instructions  Continue on BREO 1 puff daily , rinse after use  Continue on Dupixent every 2 weeks .  Albuterol inhaler As needed   Continue on Zyrtec, Singulair,  and Flonase daily  Activity as tolerated. Follow up with Dr. Marchelle Gearing in 3 months and As needed

## 2021-12-23 NOTE — Progress Notes (Signed)
@Patient  ID: , male    DOB: [birth year], 41 y.o.   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  No pulmonary lung abnormalities.     Chest x-ray: August 2022 done for cough was clear.     Exhaled nitric oxide testing was elevated at 90ppb.  Allergy panel -elevated IgE, strongly positive dust allergens.  And mildly positive cat and dog dander.  Absolute eosinophil count was 300   PFTs November 04, 2021 showed FEV1 at 74%, ratio 80, FVC 73%, no significant bronchodilator response (8% change), DLCO 120%.  12/23/2021 Follow up : Asthma  Patient returns for a 6-week follow-up.  Patient was seen last visit with asthma exacerbation despite Breo daily.  He was started on Singulair daily continued on Zyrtec and Flonase.  Dupixent was started.  Patient has an allergic phenotype with previously elevated exhaled nitric oxide testing, elevated IgE and positive allergy profile and elevated eosinophils. Patient says he has had 3 doses of Dupixent.  Has tolerated well.  Patient says overall feels that his asthma is under better control.  He is has resolution of cough and wheezing.  He does feel that his cough resolved after stopping his ACE inhibitor.  Patient has decreased his albuterol use to once a week.  Remains active plays tennis on a regular basis.  Exhaled nitric on test today in the office shows Exhaled nitric oxide testing today improved at 20ppb   Allergies  Allergen Reactions   Clonazepam Other (See Comments)   Lorazepam Other  (See Comments)     There is no immunization history on file for this patient.  Past Medical History:  Diagnosis Date   ADHD    Anxiety    Hypertension     Tobacco History: Social History   Tobacco Use  Smoking Status Former   Types: Cigarettes   Quit date: 06/13/2008   Years since quitting: 13.5  Smokeless Tobacco Never   Counseling given: Not Answered   Outpatient Medications Prior to Visit  Medication Sig Dispense Refill   albuterol (VENTOLIN HFA) 108 (90 Base) MCG/ACT inhaler SMARTSIG:By Mouth     ALPRAZolam (XANAX) 0.5 MG tablet Take 0.5 mg by mouth daily as needed for anxiety.     amLODipine (NORVASC) 5 MG tablet Take 5 mg by mouth daily.     amphetamine-dextroamphetamine (ADDERALL XR) 20 MG 24 hr capsule Take 20 mg by mouth every morning.     amphetamine-dextroamphetamine (ADDERALL) 20 MG tablet Take 20 mg by mouth daily.     Cholecalciferol (VITAMIN D3) 75 MCG (3000 UT) TABS Take 3,000 Units by mouth daily.     Dupilumab (DUPIXENT) 300 MG/2ML SOPN Inject 300 mg into the skin every 14 (fourteen) days. Loading dose completed in clinic on 11/19/21 12 mL 1   fexofenadine (ALLEGRA) 180 MG tablet Take 180 mg by mouth daily.     fluticasone (FLONASE) 50 MCG/ACT nasal spray instill 1 spray into each nostril EACH NIGHT  0   fluticasone furoate-vilanterol (BREO ELLIPTA) 200-25 MCG/ACT AEPB Inhale 1 puff into the lungs  daily. 60 each 5   montelukast (SINGULAIR) 10 MG tablet Take 1 tablet (10 mg total) by mouth at bedtime. 30 tablet 11   omeprazole (PRILOSEC) 40 MG capsule Take 40 mg by mouth as needed.  0   testosterone cypionate (DEPO-TESTOSTERONE) 200 MG/ML injection Inject 0.5 mLs (100 mg total) into the muscle every 7 (seven) days. 10 mL 1   Facility-Administered Medications Prior to Visit  Medication Dose Route Frequency Provider Last Rate Last Admin   testosterone cypionate (DEPOTESTOSTERONE CYPIONATE) injection 100 mg  100 mg Intramuscular Once Gosrani, Nimish C, MD          Review of Systems:   Constitutional:   No  weight loss, night sweats,  Fevers, chills, fatigue, or  lassitude.  HEENT:   No headaches,  Difficulty swallowing,  Tooth/dental problems, or  Sore throat,                No sneezing, itching, ear ache,  +nasal congestion, post nasal drip,   CV:  No chest pain,  Orthopnea, PND, swelling in lower extremities, anasarca, dizziness, palpitations, syncope.   GI  No heartburn, indigestion, abdominal pain, nausea, vomiting, diarrhea, change in bowel habits, loss of appetite, bloody stools.   Resp: No shortness of breath with exertion or at rest.  No excess mucus, no productive cough,  No non-productive cough,  No coughing up of blood.  No change in color of mucus.  No wheezing.  No chest wall deformity  Skin: no rash or lesions.  GU: no dysuria, change in color of urine, no urgency or frequency.  No flank pain, no hematuria   MS:  No joint pain or swelling.  No decreased range of motion.  No back pain.    Physical Exam  BP 126/80 (BP Location: Left Arm, Patient Position: Sitting, Cuff Size: Large)   Pulse 82   Temp 98 F (36.7 C) (Oral)   Ht 6\' 1"  (1.854 m)   Wt 236 lb 3.2 oz (107.1 kg)   SpO2 99%   BMI 31.16 kg/m   GEN: A/Ox3; pleasant , NAD, well nourished    HEENT:  /AT,  NOSE-clear, THROAT-clear, no lesions, no postnasal drip or exudate noted.   NECK:  Supple w/ fair ROM; no JVD; normal carotid impulses w/o bruits; no thyromegaly or nodules palpated; no lymphadenopathy.    RESP  Clear  P & A; w/o, wheezes/ rales/ or rhonchi. no accessory muscle use, no dullness to percussion  CARD:  RRR, no m/r/g, no peripheral edema, pulses intact, no cyanosis or clubbing.  GI:   Soft & nt; nml bowel sounds; no organomegaly or masses detected.   Musco: Warm bil, no deformities or joint swelling noted.   Neuro: alert, no focal deficits noted.    Skin: Warm, no lesions or rashes    Lab Results:  CBC     BNP No results  found for: "BNP"  ProBNP No results found for: "PROBNP"  Imaging: No results found.       Latest Ref Rng & Units 11/04/2021   11:48 AM  PFT Results  FVC-Pre L 3.84   FVC-Predicted Pre % 68   FVC-Post L 4.09   FVC-Predicted Post % 73   Pre FEV1/FVC % % 79   Post FEV1/FCV % % 80   FEV1-Pre L 3.03   FEV1-Predicted Pre % 68   FEV1-Post L 3.28   DLCO uncorrected ml/min/mmHg 39.23   DLCO UNC% % 120   DLCO corrected ml/min/mmHg 39.23  DLCO COR %Predicted % 120   DLVA Predicted % 142   TLC L 5.97   TLC % Predicted % 81   RV % Predicted % 85     Lab Results  Component Value Date   NITRICOXIDE 90 08/09/2021        Assessment & Plan:   Asthma Severe persistent asthma with allergic phenotype -improved asthma control on current regimen with Breo, Singulair, Zyrtec, Flonase, Dupixent.  Exhaled nitric oxide testing today is significantly improved.  Continue with trigger prevention.  Continue on current maintenance regimen.  Plan  Patient Instructions  Continue on BREO 1 puff daily , rinse after use  Continue on Dupixent every 2 weeks .  Albuterol inhaler As needed   Continue on Zyrtec, Singulair,  and Flonase daily  Activity as tolerated. Follow up with Dr. Marchelle Gearing in 3 months and As needed      Allergic rhinitis Continue on current regimen.  Continue with trigger prevention.  Plan  Patient Instructions  Continue on BREO 1 puff daily , rinse after use  Continue on Dupixent every 2 weeks .  Albuterol inhaler As needed   Continue on Zyrtec, Singulair,  and Flonase daily  Activity as tolerated. Follow up with Dr. Marchelle Gearing in 3 months and As needed        Rubye Oaks, NP 12/23/2021

## 2021-12-23 NOTE — Assessment & Plan Note (Signed)
Severe persistent asthma with allergic phenotype -improved asthma control on current regimen with Breo, Singulair, Zyrtec, Flonase, Dupixent.  Exhaled nitric oxide testing today is significantly improved.  Continue with trigger prevention.  Continue on current maintenance regimen.  Plan  Patient Instructions  Continue on BREO 1 puff daily , rinse after use  Continue on Dupixent every 2 weeks .  Albuterol inhaler As needed   Continue on Zyrtec, Singulair,  and Flonase daily  Activity as tolerated. Follow up with Dr. Marchelle Gearing in 3 months and As needed

## 2021-12-23 NOTE — Patient Instructions (Addendum)
Continue on BREO 1 puff daily , rinse after use  Continue on Dupixent every 2 weeks .  Albuterol inhaler As needed   Continue on Zyrtec, Singulair,  and Flonase daily  Activity as tolerated. Follow up with Dr. Marchelle Gearing in 3 months and As needed

## 2021-12-25 ENCOUNTER — Ambulatory Visit: Payer: BC Managed Care – PPO | Admitting: Orthopaedic Surgery

## 2021-12-25 ENCOUNTER — Encounter: Payer: Self-pay | Admitting: Orthopaedic Surgery

## 2021-12-25 ENCOUNTER — Ambulatory Visit (INDEPENDENT_AMBULATORY_CARE_PROVIDER_SITE_OTHER): Payer: BC Managed Care – PPO

## 2021-12-25 ENCOUNTER — Other Ambulatory Visit: Payer: Self-pay

## 2021-12-25 DIAGNOSIS — M79672 Pain in left foot: Secondary | ICD-10-CM

## 2021-12-25 LAB — URIC ACID: Uric Acid, Serum: 7 mg/dL (ref 4.0–8.0)

## 2021-12-25 MED ORDER — PREDNISONE 10 MG (21) PO TBPK: ORAL_TABLET | ORAL | 3 refills | Status: DC

## 2021-12-25 NOTE — Progress Notes (Signed)
Office Visit Note   Patient: Jeffrey English           Date of Birth: 02-Mar-1980           MRN: [id number] Visit Date: 12/25/2021              Requested by: Blair Heys, MD [address] [place],  [place] [address] PCP: Blair Heys, MD   Assessment & Plan: Visit Diagnoses:  1. Pain in left foot     Plan: Impression is left great toe MTP inflammation.  Could be gout versus flare of bunion versus arthritis.  I recommend a carbon footplate for immobilization.  We will place him on a 6-day steroid Dosepak.  Rest and ice and elevate and resume activities as symptoms allow.  Uric acid level drawn today.  Follow-up as needed.  Follow-Up Instructions: No follow-ups on file.   Orders:  Orders Placed This Encounter  Procedures   XR Foot Complete Left   Meds ordered this encounter  Medications   predniSONE (STERAPRED UNI-PAK 21 TAB) 10 MG (21) TBPK tablet    Sig: Take as directed    Dispense:  21 tablet    Refill:  3      Procedures: No procedures performed   Clinical Data: No additional findings.   Subjective: Chief Complaint  Patient presents with   Left Foot - Pain    HPI Patient is 41 year old gentleman who comes in for evaluation of cute onset of left foot pain localized to the great toe that started Sunday.  Denies any injuries.  This has progressively gotten worse and red.  Denies history of gout.  Ibuprofen originally helped but did not help last night.  Denies any constitutional symptoms.  He has had pain with standing and walking.  Review of Systems  Constitutional: Negative.   All other systems reviewed and are negative.    Objective: Vital Signs: There were no vitals taken for this visit.  Physical Exam Vitals and nursing note reviewed.  Constitutional:      Appearance: He is well-developed.  Pulmonary:     Effort: Pulmonary effort is normal.  Abdominal:     Palpations: Abdomen is soft.  Skin:    General: Skin is warm.   Neurological:     Mental Status: He is alert and oriented to person, place, and time.  Psychiatric:        Behavior: Behavior normal.        Thought Content: Thought content normal.        Judgment: Judgment normal.     Ortho Exam Examination of the left foot shows a swollen great toe MTP joint with swelling and increased warmth and erythema.  Gentle range of motion is well-tolerated.  Soft tissues are tender to touch. Specialty Comments:  No specialty comments available.  Imaging: XR Foot Complete Left  Result Date: 12/25/2021 Three-view x-rays of the left foot shows a mild bunion deformity..  There is a small subchondral cyst in the first metatarsal head.  There are no degenerative changes to the MTP joint.  Joint spaces well-preserved.  No acute abnormalities.  There is some soft tissue swelling medial to the first MTP joint.    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   Rectal bleeding 06/13/2013   KNEE PAIN, RIGHT, ACUTE 11/26/2009   OTHER ACQUIRED  DEFORMITY OF ANKLE AND FOOT OTHER 05/23/2009   Past Medical History:  Diagnosis Date   ADHD    Anxiety    Hypertension     Family History  Problem Relation Age of Onset   Cancer Paternal Uncle 72       lung cancer    Past Surgical History:  Procedure Laterality Date   HEMORROIDECTOMY  2014   INGUINAL HERNIA REPAIR Left 06/27/2019   Procedure: LAPAROSCOPIC LEFT INGUINAL HERNIA REPAIR WITH MESH;  Surgeon: [name], MD;  Location: [facility];  Service: General;  Laterality: Left;   NASAL SEPTOPLASTY W/ TURBINOPLASTY Bilateral 01/16/2020   Procedure: NASAL SEPTOPLASTY WITH TURBINATE REDUCTION;  Surgeon: Osborn Coho, MD;  Location: [facility];  Service: ENT;  Laterality: Bilateral;   Social History   Occupational History   Not on  file  Tobacco Use   Smoking status: Former    Types: Cigarettes    Quit date: 06/13/2008    Years since quitting: 13.5   Smokeless tobacco: Never  Vaping Use   Vaping Use: Never used  Substance and Sexual Activity   Alcohol use: Yes    Alcohol/week: 7.0 standard drinks of alcohol    Types: 7 Shots of liquor per week   Drug use: No   Sexual activity: Not on file

## 2021-12-31 ENCOUNTER — Other Ambulatory Visit: Payer: Self-pay | Admitting: Orthopaedic Surgery

## 2021-12-31 MED ORDER — COLCHICINE 0.6 MG PO TABS: 0.6000 mg | ORAL_TABLET | Freq: Two times a day (BID) | ORAL | 0 refills | Status: AC | PRN

## 2022-02-19 ENCOUNTER — Other Ambulatory Visit: Payer: Self-pay | Admitting: Internal Medicine

## 2022-03-02 ENCOUNTER — Telehealth: Payer: BC Managed Care – PPO | Admitting: Family

## 2022-03-02 DIAGNOSIS — H109 Unspecified conjunctivitis: Secondary | ICD-10-CM | POA: Diagnosis not present

## 2022-03-02 MED ORDER — POLYMYXIN B-TRIMETHOPRIM 10000-0.1 UNIT/ML-% OP SOLN: 1.0000 [drp] | Freq: Four times a day (QID) | OPHTHALMIC | 0 refills | Status: DC

## 2022-03-02 NOTE — Progress Notes (Signed)

## 2022-04-03 ENCOUNTER — Encounter: Payer: Self-pay | Admitting: Internal Medicine

## 2022-04-03 ENCOUNTER — Ambulatory Visit: Payer: BC Managed Care – PPO | Admitting: Internal Medicine

## 2022-04-03 VITALS — BP 132/70 | HR 78 | Temp 98.7°F | Ht 72.0 in | Wt 231.0 lb

## 2022-04-03 DIAGNOSIS — J4541 Moderate persistent asthma with (acute) exacerbation: Secondary | ICD-10-CM

## 2022-04-03 DIAGNOSIS — J309 Allergic rhinitis, unspecified: Secondary | ICD-10-CM | POA: Diagnosis not present

## 2022-04-03 DIAGNOSIS — D721 Eosinophilia, unspecified: Secondary | ICD-10-CM

## 2022-04-03 NOTE — Progress Notes (Signed)
OV 08/09/2021  Subjective:  Patient ID: Jeffrey English, male , DOB: 1980/08/10 , age 42 y.o. , MRN: [id number] , ADDRESS: [place] [id number] PCP Gaynelle Arabian, MD Patient Care Team: Gaynelle Arabian, MD as PCP - General (Family Medicine)  This Provider for this visit: Treatment Team:  Attending Provider: Margaretha Seeds, MD    08/09/2021 -   Chief Complaint  Patient presents with   Consult    Pt states that he has been having complaints of wheezing as well as SOB which began 2 months ago. States that he did have asthma a a child. Pt also has had a lingering cough off and on for the past 2 years.     HPI [name] 42 y.o. 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  He tells me that he had asthma as a child and then outgrew.  In the past he is also had deviated nasal septum and ENT surgery for which a lot of mucus was cleaned out by Dr. Wilburn Cornelia.  At that time antibiotic help.  Has been on Flonase.  He said he had COVID 2 years ago and then after that for the last 2 years he has had intermittent low-grade chronic cough.  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  Then in April 2023 sudden degeneration into chronic cough with congestion and wheezing.  Symptoms are present at night.  I believe he took some treatment and then he started feeling better.  Then in May 2023 he went to [place] with his wife and that the cough got worse.  He started using his albuterol more.  This seemed to help.  His symptoms particularly worse at night.  Then he came back to the [place] after his vacation.  Around Jun 29, 2021 he went to urgent care.  He was found to be wheezing he was given 5-day prednisone also given doxycycline.  He says this helped but not fully.  However he has been able to play tennis.  Then this past week he feels his symptoms have come back particularly similar to the episode in April.  His a lot of cough is wheezing.  Symptoms are present at night.   Yet in the daytime is able to play tennis.  This particular week has been bad with a lot of congestion throat pain cough wheezing.  Most symptoms are at night.  Albuterol is helping but only somewhat.  No fever    CT Chest data November 2020:-coronary calcium CT November 2020: Calcium score 0.  No pulmonary lung abnormalities.  [Personally visualized]  Chest x-ray: August 2022 done for cough was clear.  [Personally visualized the image]  Noted to be on testosterone for the last 1 year.  Monitors his PSA.     09/02/21 Follow up : Asthma  Patient returns for a 1 month follow-up.  Last visit presented for ongoing cough wheezing and shortness of breath.  Patient says he had increased symptoms since having COVID-19 in 2021.  He has had ongoing daily cough.  Last visit he was treated with a prednisone taper.  Started on Breo 200 once daily.  Exhaled nitric oxide testing was elevated at 90ppb.  Labs showed an elevated IgE, strongly positive dust mite allergens.  And mildly positive cat and dog dander.  Absolute eosinophil count was 300 D-dimer was negative.  Chest x-ray showed no acute process.  Some mild peribronchial thickening. Since last visit patient is feeling much  better.  Cough and wheezing are substantially improved.  Patient continues to have some lingering dry cough.  Patient is on ACE inhibitor.  Patient also has chronic allergies.  Is on Allegra and Flonase.  PFTs were ordered but have not been scheduled yet.  He denies any fever, discolored mucus chest pain orthopnea PND or increased leg swelling         OV 11/11/2021  Subjective:  Patient ID: Jeffrey English, male , DOB: 01/22/1981 , age 42 y.o. , MRN: [id number] , ADDRESS: [place] [id number] PCP Gaynelle Arabian, MD Patient Care Team: Gaynelle Arabian, MD as PCP - General (Family Medicine)  This Provider for this visit: Treatment Team:  Attending Provider: Brand Males, MD    11/11/2021 -   Chief  Complaint  Patient presents with   Follow-up    Pt states he started wheezing at night a couple weeks ago and also had congestion in chest.     HPI [name] 42 y.o. -returns for follow-up.  He says after he saw me he started the [facility] took a short course of oral prednisone and was doing really well.  His results show that he does have elevated IgE and also elevated eosinophils and strongly positive RAST allergy panel to house dust mite.  However he says in the last few weeks he is starting to get symptomatic again.  He does start having wheezing intermittently although he is not waking up from it.  No chest tightness.  No cough.  He is still able to play tennis quite well.  He did have cough but that resolved after he stopped his lisinopril.  He feels he is sleeping back although is not as worse as he was 1 year ago.  He did pulmonary function test on November 04, 2021 and this shows resstriction with high DLCO.  His spirometry is not classic for asthma but he did have +8% bronchodilator response.  His DLCO is elevated and his consistent with asthma findings.        12/23/2021 Follow up : Asthma  Patient returns for a 6-week follow-up.  Patient was seen last visit with asthma exacerbation despite Breo daily.  He was started on Singulair daily continued on Zyrtec and Flonase.  Dupixent was started.  Patient has an allergic phenotype with previously elevated exhaled nitric oxide testing, elevated IgE and positive allergy profile and elevated eosinophils. Patient says he has had 3 doses of Dupixent.  Has tolerated well.  Patient says overall feels that his asthma is under better control.  He is has resolution of cough and wheezing.  He does feel that his cough resolved after stopping his ACE inhibitor.  Patient has decreased his albuterol use to once a week.  Remains active plays tennis on a regular basis.  Exhaled nitric on test today in the office shows Exhaled nitric oxide testing today improved  at 20ppb  TEST/EVENTS :  CT Chest data November 2020:-coronary calcium CT November 2020: Calcium score 0.  No pulmonary lung abnormalities.     Chest x-ray: August 2022 done for cough was clear.     Exhaled nitric oxide testing was elevated at 90ppb.  Allergy panel -elevated IgE, strongly positive dust allergens.  And mildly positive cat and dog dander.  Absolute eosinophil count was 300   PFTs November 04, 2021 showed FEV1 at 74%, ratio 80, FVC 73%, no significant bronchodilator response (8% change), DLCO 120%.  OV 04/03/2022  Subjective:  Patient ID: Jeffrey English,  male , DOB: 1981-01-28 , age 41 y.o. , MRN: [id number] , ADDRESS: [place] [id number] PCP Gaynelle Arabian, MD Patient Care Team: Gaynelle Arabian, MD as PCP - General (Family Medicine)  This Provider for this visit: Treatment Team:  Attending Provider: Brand Males, MD    04/03/2022 -   Chief Complaint  Patient presents with   Follow-up    No c/o      HPI Cheng Winne 42 y.o. -returns for follow-up.  After I saw him he did see nurse practitioner.  He is currently on Dupixent every 2 weeks.  Occasionally he forgets the exact schedule but then gets it done within a window few days.  He continues on [place].  He continues on albuterol as needed but he rarely ever needs it.  He is now off Singulair.  He does take his antihistamine on a regular basis.  And his Flonase on as-needed basis.  His asthma is well-controlled.  He has played tennis and he feels his effort tolerance and tenderness is better.  No nocturnal awakenings known albuterol rescue use.  No exacerbations.       PFT     Latest Ref Rng & Units 11/04/2021   11:48 AM  PFT Results  FVC-Pre L 3.84   FVC-Predicted Pre % 68   FVC-Post L 4.09   FVC-Predicted Post % 73   Pre FEV1/FVC % % 79   Post FEV1/FCV % % 80   FEV1-Pre L 3.03   FEV1-Predicted Pre % 68   FEV1-Post L 3.28   DLCO uncorrected ml/min/mmHg 39.23   DLCO UNC% % 120    DLCO corrected ml/min/mmHg 39.23   DLCO COR %Predicted % 120   DLVA Predicted % 142   TLC L 5.97   TLC % Predicted % 81   RV % Predicted % 85        has a past medical history of ADHD, Anxiety, and Hypertension.   reports that he quit smoking about 13 years ago. His smoking use included cigarettes. He has never used smokeless tobacco.  Past Surgical History:  Procedure Laterality Date   HEMORROIDECTOMY  2014   INGUINAL HERNIA REPAIR Left 06/27/2019   Procedure: LAPAROSCOPIC LEFT INGUINAL HERNIA REPAIR WITH MESH;  Surgeon: Coralie Keens, MD;  Location: [facility];  Service: General;  Laterality: Left;   NASAL SEPTOPLASTY W/ TURBINOPLASTY Bilateral 01/16/2020   Procedure: NASAL SEPTOPLASTY WITH TURBINATE REDUCTION;  Surgeon: Jerrell Belfast, MD;  Location: [facility];  Service: ENT;  Laterality: Bilateral;    Allergies  Allergen Reactions   Clonazepam Other (See Comments)   Lorazepam Other (See Comments)     There is no immunization history on file for this patient.  Family History  Problem Relation Age of Onset   Cancer Paternal Uncle 32       lung cancer     Current Outpatient Medications:    albuterol (VENTOLIN HFA) 108 (90 Base) MCG/ACT inhaler, SMARTSIG:By Mouth, Disp: , Rfl:    ALPRAZolam (XANAX) 0.5 MG tablet, Take 0.5 mg by mouth daily as needed for anxiety., Disp: , Rfl:    amLODipine (NORVASC) 5 MG tablet, Take 5 mg by mouth daily., Disp: , Rfl:    amphetamine-dextroamphetamine (ADDERALL XR) 20 MG 24 hr capsule, Take 20 mg by mouth every morning., Disp: , Rfl:    amphetamine-dextroamphetamine (ADDERALL) 20 MG tablet, Take 20 mg by mouth daily., Disp: , Rfl:    BREO ELLIPTA 200-25 MCG/ACT AEPB, INHALE  1 PUFF INTO THE LUNGS DAILY, Disp: 60 each, Rfl: 5   Cholecalciferol (VITAMIN D3) 75 MCG (3000 UT) TABS, Take 3,000 Units by mouth daily., Disp: , Rfl:    colchicine 0.6 MG tablet, Take 1 tablet (0.6 mg total) by mouth 2 (two)  times daily as needed., Disp: 20 tablet, Rfl: 0   Dupilumab (DUPIXENT) 300 MG/2ML SOPN, Inject 300 mg into the skin every 14 (fourteen) days. Loading dose completed in clinic on 11/19/21, Disp: 12 mL, Rfl: 1   fexofenadine (ALLEGRA) 180 MG tablet, Take 180 mg by mouth daily., Disp: , Rfl:    fluticasone (FLONASE) 50 MCG/ACT nasal spray, instill 1 spray into each nostril EACH NIGHT, Disp: , Rfl: 0   montelukast (SINGULAIR) 10 MG tablet, Take 1 tablet (10 mg total) by mouth at bedtime., Disp: 30 tablet, Rfl: 11   omeprazole (PRILOSEC) 40 MG capsule, Take 40 mg by mouth as needed., Disp: , Rfl: 0   predniSONE (STERAPRED UNI-PAK 21 TAB) 10 MG (21) TBPK tablet, Take as directed, Disp: 21 tablet, Rfl: 3   testosterone cypionate (DEPO-TESTOSTERONE) 200 MG/ML injection, Inject 0.5 mLs (100 mg total) into the muscle every 7 (seven) days., Disp: 10 mL, Rfl: 1   trimethoprim-polymyxin b (POLYTRIM) ophthalmic solution, Place 1 drop into the left eye every 6 (six) hours., Disp: 10 mL, Rfl: 0  Current Facility-Administered Medications:    testosterone cypionate (DEPOTESTOSTERONE CYPIONATE) injection 100 mg, 100 mg, Intramuscular, Once, Gosrani, Nimish C, MD      Objective:   Vitals:   04/03/22 0832  BP: 132/70  Pulse: 78  Temp: 98.7 F (37.1 C)  TempSrc: Oral  SpO2: 96%  Weight: 231 lb (104.8 kg)  Height: 6' (1.829 m)    Estimated body mass index is 31.33 kg/m as calculated from the following:   Height as of this encounter: 6' (1.829 m).   Weight as of this encounter: 231 lb (104.8 kg).  @WEIGHTCHANGE$ @  [employer]   04/03/22 0832  Weight: 231 lb (104.8 kg)     Physical Exam General: No distress. Looks well Neuro: Alert and Oriented x 3. GCS 15. Speech normal Psych: Pleasant Resp:  Barrel Chest - no.  Wheeze - no, Crackles - no, No overt respiratory distress CVS: Normal heart sounds. Murmurs - no Ext: Stigmata of Connective Tissue Disease - no HEENT: Normal upper airway. PEERL +.  No post nasal drip        Assessment:       ICD-10-CM   1. Moderate persistent asthma with acute exacerbation  J45.41     2. Allergic rhinitis, unspecified seasonality, unspecified trigger  J30.9     3. Eosinophilia, unspecified type  D72.10          Plan:     Patient Instructions  Continue on BREO 1 puff daily , rinse after use  Continue on Dupixent every 2 weeks .  Albuterol inhaler As needed   Ok to be off Singulair Stop anti-histamine tablet Allegra/Zyrtec and monitor Continue  Flonase daily  Activity as tolerated. Follow up with Dr. Chase Caller in 6-8 months and As needed      SIGNATURE    Dr. Brand Males, M.D., F.C.C.P,  Pulmonary and Critical Care Medicine Staff Physician, [place] Director - Interstitial Lung Disease  Program  Pulmonary [place] [facility] [place], [place], [address]  Pager: [phone], If no answer or between  15:00h - 7:00h: call [phone]  [id number] Telephone: [phone]  8:47 AM 04/03/2022

## 2022-04-03 NOTE — Patient Instructions (Addendum)
ICD-10-CM   1. Moderate persistent asthma with acute exacerbation  J45.41     2. Allergic rhinitis, unspecified seasonality, unspecified trigger  J30.9     3. Eosinophilia, unspecified type  D72.10        Continue on BREO 1 puff daily , rinse after use  Continue on Dupixent every 2 weeks .  Albuterol inhaler As needed   Ok to be off Singulair Stop anti-histamine tablet Allegra/Zyrtec and monitor Continue  Flonase daily  Activity as tolerated. Follow up with Dr. Chase Caller in 6-8 months and As needed

## 2022-04-21 ENCOUNTER — Other Ambulatory Visit: Payer: Self-pay | Admitting: Internal Medicine

## 2022-04-21 DIAGNOSIS — D721 Eosinophilia, unspecified: Secondary | ICD-10-CM

## 2022-04-21 DIAGNOSIS — J4541 Moderate persistent asthma with (acute) exacerbation: Secondary | ICD-10-CM

## 2022-04-21 NOTE — Telephone Encounter (Signed)
Refill sent for [place] to [facility]: [phone]  Dose: 300 mg SQ every 2 weeks  Last OV: 04/03/22 Provider: Dr. Chase Caller  Next OV: 6-8 months (not yet scheduled)  Knox Saliva, PharmD, MPH, BCPS Clinical Pharmacist (Rheumatology and Pulmonology)

## 2022-04-22 ENCOUNTER — Telehealth: Payer: Self-pay | Admitting: Pharmacist

## 2022-04-22 NOTE — Telephone Encounter (Signed)
Received PA renewal form from [facility] for patient's Dupixent. Compelted and faxed with clinicals  Fax: [phone] Phone: [phone] Case # [id number]  Knox Saliva, PharmD, MPH, BCPS, CPP Clinical Pharmacist (Rheumatology and Pulmonology)

## 2022-04-22 NOTE — Telephone Encounter (Signed)
PA Approved and scanned to chart:

## 2022-05-26 ENCOUNTER — Telehealth: Payer: Self-pay | Admitting: Internal Medicine

## 2022-05-26 NOTE — Telephone Encounter (Signed)
Pt called the office with a question for MR but he said he would send a mychart message to MR. Nothing further needed.

## 2022-05-27 ENCOUNTER — Encounter: Payer: Self-pay | Admitting: Internal Medicine

## 2022-05-29 NOTE — Telephone Encounter (Signed)
Okay thanks for letting us know.  If you are interested you could do a short 5-day course of prednisone to make you feel better and get rid of the inflammation  Plan  - Please take prednisone 40 mg x1 day, then 30 mg x1 day, then 20 mg x1 day, then 10 mg x1 day, and then 5 mg x1 day and stop

## 2022-08-19 ENCOUNTER — Other Ambulatory Visit: Payer: Self-pay | Admitting: Internal Medicine

## 2022-09-23 ENCOUNTER — Telehealth: Payer: Self-pay | Admitting: Internal Medicine

## 2022-09-23 DIAGNOSIS — D721 Eosinophilia, unspecified: Secondary | ICD-10-CM

## 2022-09-23 DIAGNOSIS — J4541 Moderate persistent asthma with (acute) exacerbation: Secondary | ICD-10-CM

## 2022-09-26 NOTE — Telephone Encounter (Signed)
Refill sent for DUPIXENT to [facility]: [phone]  Dose: 300 mg SQ every 2 weeks  Last OV: 04/03/2022 Provider: Dr. Marchelle Gearing  Next OV: not scheduled but due   Chesley Mires, PharmD, MPH, BCPS Clinical Pharmacist (Rheumatology and Pulmonology)

## 2022-10-20 ENCOUNTER — Encounter: Payer: Self-pay | Admitting: Internal Medicine

## 2022-10-20 NOTE — Telephone Encounter (Signed)
Patient is scheduled for next available with MR on 10/31. He stated his dupixent is now $1050. Wanted you aware!

## 2022-10-21 NOTE — Telephone Encounter (Signed)
Per [facility], rep states the Dupixent copay card is covering everything and patient has no copay. Order is scheduled for 10/22/2022.   Nothing further needed  Chesley Mires, PharmD, MPH, BCPS, CPP Clinical Pharmacist (Rheumatology and Pulmonology)

## 2022-10-21 NOTE — Telephone Encounter (Signed)
Per [facility], rep states the Dupixent copay card is covering everything and patient has no copay. Order is scheduled for 10/22/2022.

## 2022-12-10 ENCOUNTER — Ambulatory Visit: Payer: BC Managed Care – PPO | Admitting: Internal Medicine

## 2022-12-10 ENCOUNTER — Encounter: Payer: Self-pay | Admitting: Internal Medicine

## 2022-12-10 VITALS — BP 128/83 | HR 87 | Temp 98.2°F | Ht 72.0 in | Wt 222.6 lb

## 2022-12-10 DIAGNOSIS — J4540 Moderate persistent asthma, uncomplicated: Secondary | ICD-10-CM

## 2022-12-10 NOTE — Patient Instructions (Addendum)
ICD-10-CM   1. Moderate persistent asthma without complication  J45.40       No flare up Stable diseae   pLAN Continue on BREO 1 puff daily , rinse after use  Continue on Dupixent every 2 weeks .  Albuterol inhaler As needed   Ok to be STOP  Singulair Take  anti-histamine tablet Allegra/Zyrtec and monitor Continue  Flonase daily  Activity as tolerated.  Respect deferral flu shot  PLAN Follow up with Dr. Marchelle Gearing in 9 months and As needed

## 2022-12-10 NOTE — Progress Notes (Signed)
OV 08/09/2021  Subjective:  Patient ID: Jeffrey English, male , DOB: [birth year] , age 42 y.o. , MRN: [id number] , ADDRESS: [address] [place] [facility] [address] PCP Jeffrey Heys, MD Patient Care Team: Jeffrey Heys, MD as PCP - General (Family Medicine)  This Provider for this visit: Treatment Team:  Attending Provider: Luciano Cutter, MD    08/09/2021 -   Chief Complaint  Patient presents with   Consult    Pt states that he has been having complaints of wheezing as well as SOB which began 2 months ago. States that he did have asthma a a child. Pt also has had a lingering cough off and on for the past 2 years.     HPI Jeffrey English 42 y.o. 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  He tells me that he had asthma as a child and then outgrew.  In the past he is also had deviated nasal septum and ENT surgery for which a lot of mucus was cleaned out by Dr. Annalee English.  At that time antibiotic help.  Has been on Flonase.  He said he had COVID 2 years ago and then after that for the last 2 years he has had intermittent low-grade chronic cough.  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  Then in April 2023 sudden degeneration into chronic cough with congestion and wheezing.  Symptoms are present at night.  I believe he took some treatment and then he started feeling better.  Then in May 2023 he went to [place] with his wife and that the cough got worse.  He started using his albuterol more.  This seemed to help.  His symptoms particularly worse at night.  Then he came back to the [place] after his vacation.  Around Jun 29, 2021 he went to urgent care.  He was found to be wheezing he was given 5-day prednisone also given doxycycline.  He says this helped but not fully.  However he has been able to play tennis.  Then this past week he feels his symptoms have come back particularly similar to the episode in April.  His a lot of cough is wheezing.  Symptoms are present at night.   Yet in the daytime is able to play tennis.  This particular week has been bad with a lot of congestion throat pain cough wheezing.  Most symptoms are at night.  Albuterol is helping but only somewhat.  No fever    CT Chest data November 2020:-coronary calcium CT November 2020: Calcium score 0.  No pulmonary lung abnormalities.  [Personally visualized]  Chest x-ray: August 2022 done for cough was clear.  [Personally visualized the image]  Noted to be on testosterone for the last 1 year.  Monitors his PSA.     09/02/21 Follow up : Asthma  Patient returns for a 1 month follow-up.  Last visit presented for ongoing cough wheezing and shortness of breath.  Patient says he had increased symptoms since having COVID-19 in 2021.  He has had ongoing daily cough.  Last visit he was treated with a prednisone taper.  Started on Breo 200 once daily.  Exhaled nitric oxide testing was elevated at 90ppb.  Labs showed an elevated IgE, strongly positive dust mite allergens.  And mildly positive cat and dog dander.  Absolute eosinophil count was 300 D-dimer was negative.  Chest x-ray showed no acute process.  Some mild peribronchial thickening. Since last visit patient is feeling  much better.  Cough and wheezing are substantially improved.  Patient continues to have some lingering dry cough.  Patient is on ACE inhibitor.  Patient also has chronic allergies.  Is on Allegra and Flonase.  PFTs were ordered but have not been scheduled yet.  He denies any fever, discolored mucus chest pain orthopnea PND or increased leg swelling         OV 11/11/2021  Subjective:  Patient ID: Jeffrey English, male , DOB: 1980/06/27 , age 42 y.o. , MRN: [id number] , ADDRESS: [address] [place] [facility] [address] PCP Jeffrey Heys, MD Patient Care Team: Jeffrey Heys, MD as PCP - General (Family Medicine)  This Provider for this visit: Treatment Team:  Attending Provider: Kalman Shan, MD    11/11/2021 -   Chief  Complaint  Patient presents with   Follow-up    Pt states he started wheezing at night a couple weeks ago and also had congestion in chest.     HPI Jeffrey English 42 y.o. -returns for follow-up.  He says after he saw me he started the [facility] took a short course of oral prednisone and was doing really well.  His results show that he does have elevated IgE and also elevated eosinophils and strongly positive RAST allergy panel to house dust mite.  However he says in the last few weeks he is starting to get symptomatic again.  He does start having wheezing intermittently although he is not waking up from it.  No chest tightness.  No cough.  He is still able to play tennis quite well.  He did have cough but that resolved after he stopped his lisinopril.  He feels he is sleeping back although is not as worse as he was 1 year ago.  He did pulmonary function test on November 04, 2021 and this shows resstriction with high DLCO.  His spirometry is not classic for asthma but he did have +8% bronchodilator response.  His DLCO is elevated and his consistent with asthma findings.        12/23/2021 Follow up : Asthma  Patient returns for a 6-week follow-up.  Patient was seen last visit with asthma exacerbation despite Breo daily.  He was started on Singulair daily continued on Zyrtec and Flonase.  Dupixent was started.  Patient has an allergic phenotype with previously elevated exhaled nitric oxide testing, elevated IgE and positive allergy profile and elevated eosinophils. Patient says he has had 3 doses of Dupixent.  Has tolerated well.  Patient says overall feels that his asthma is under better control.  He is has resolution of cough and wheezing.  He does feel that his cough resolved after stopping his ACE inhibitor.  Patient has decreased his albuterol use to once a week.  Remains active plays tennis on a regular basis.  Exhaled nitric on test today in the office shows Exhaled nitric oxide testing today improved  at 20ppb  TEST/EVENTS :  CT Chest data November 2020:-coronary calcium CT November 2020: Calcium score 0.  No pulmonary lung abnormalities.     Chest x-ray: August 2022 done for cough was clear.     Exhaled nitric oxide testing was elevated at 90ppb.  Allergy panel -elevated IgE, strongly positive dust allergens.  And mildly positive cat and dog dander.  Absolute eosinophil count was 300   PFTs November 04, 2021 showed FEV1 at 74%, ratio 80, FVC 73%, no significant bronchodilator response (8% change), DLCO 120%.  OV 04/03/2022  Subjective:  Patient ID: Jeffrey Flock  English, male , DOB: [birth year] , age 57 y.o. , MRN: [id number] , ADDRESS: [address] [place] [facility] [phone] PCP Jeffrey Heys, MD Patient Care Team: Jeffrey Heys, MD as PCP - General (Family Medicine)  This Provider for this visit: Treatment Team:  Attending Provider: Kalman Shan, MD    04/03/2022 -   Chief Complaint  Patient presents with   Follow-up    No c/o      HPI Jeffrey English 42 y.o. -returns for follow-up.  After I saw him he did see nurse practitioner.  He is currently on Dupixent every 2 weeks.  Occasionally he forgets the exact schedule but then gets it done within a window few days.  He continues on [place].  He continues on albuterol as needed but he rarely ever needs it.  He is now off Singulair.  He does take his antihistamine on a regular basis.  And his Flonase on as-needed basis.  His asthma is well-controlled.  He has played tennis and he feels his effort tolerance and tenderness is better.  No nocturnal awakenings known albuterol rescue use.  No exacerbations.   OV 12/10/2022  Subjective:  Patient ID: Jeffrey English, male , DOB: 15-Jul-1980 , age 19 y.o. 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 -returns for his routine asthma follow-up.  He continues to play tennis and be very active.  His albuterol use is very rare it happened once after tennis.  He otherwise is doing well.  He is taking Dupixent.  He is on Allegra he is on Breo he is on Singulair.  No hospitalizations no ER visits no urgent care visits no surgeries.  Exhaled nitric oxide is normal today and this is a big improvement.  He is not interested in the flu shot today.  Currently no wheezing.  No nocturnal awakenings.     Asthma Control Test ACT Total Score  04/03/2022  8:31 AM 23  12/23/2021  3:08 PM 21  09/02/2021  3:13 PM 24      Lab Results  Component Value Date   NITRICOXIDE 90 08/09/2021     PFT     Latest Ref Rng & Units 11/04/2021   11:48 AM  ILD indicators  FVC-Pre L 3.84   FVC-Predicted Pre % 68   FVC-Post L 4.09   FVC-Predicted Post % 73   TLC L 5.97   TLC Predicted % 81   DLCO uncorrected ml/min/mmHg 39.23   DLCO UNC %Pred % 120   DLCO Corrected ml/min/mmHg 39.23   DLCO COR %Pred % 120       LAB RESULTS last 96 hours No results found.  LAB RESULTS last 90 days No results found for this or any previous visit (from the past 2160 hour(s)).       has a past medical history of ADHD, Anxiety, and Hypertension.   reports that  he quit smoking about 14 years ago. His smoking use included cigarettes. He has never used smokeless tobacco.  Past Surgical History:  Procedure Laterality Date   HEMORROIDECTOMY  2014   INGUINAL HERNIA REPAIR Left 06/27/2019   Procedure: LAPAROSCOPIC LEFT  INGUINAL HERNIA REPAIR WITH MESH;  Surgeon: [name], MD;  Location: [facility];  Service: General;  Laterality: Left;   NASAL SEPTOPLASTY W/ TURBINOPLASTY Bilateral 01/16/2020   Procedure: NASAL SEPTOPLASTY WITH TURBINATE REDUCTION;  Surgeon: Osborn Coho, MD;  Location: [facility];  Service: ENT;  Laterality: Bilateral;    Allergies  Allergen Reactions   Clonazepam Other (See Comments)   Lorazepam Other (See Comments)     There is no immunization history on file for this patient.  Family History  Problem Relation Age of Onset   Cancer Paternal Uncle 43       lung cancer     Current Outpatient Medications:    albuterol (VENTOLIN HFA) 108 (90 Base) MCG/ACT inhaler, SMARTSIG:By Mouth, Disp: , Rfl:    ALPRAZolam (XANAX) 0.5 MG tablet, Take 0.5 mg by mouth daily as needed for anxiety., Disp: , Rfl:    amLODipine (NORVASC) 5 MG tablet, Take 5 mg by mouth daily., Disp: , Rfl:    amphetamine-dextroamphetamine (ADDERALL XR) 20 MG 24 hr capsule, Take 20 mg by mouth every morning., Disp: , Rfl:    amphetamine-dextroamphetamine (ADDERALL) 20 MG tablet, Take 20 mg by mouth daily., Disp: , Rfl:    BREO ELLIPTA 200-25 MCG/ACT AEPB, INHALE 1 PUFF INTO THE LUNGS DAILY, Disp: 60 each, Rfl: 3   Cholecalciferol (VITAMIN D3) 75 MCG (3000 UT) TABS, Take 3,000 Units by mouth daily., Disp: , Rfl:    colchicine 0.6 MG tablet, Take 1 tablet (0.6 mg total) by mouth 2 (two) times daily as needed., Disp: 20 tablet, Rfl: 0   Dupilumab (DUPIXENT) 300 MG/2ML SOPN, INJECT 1 PEN UNDER THE SKIN EVERY 14 DAYS, Disp: 12 mL, Rfl: 0   fexofenadine (ALLEGRA) 180 MG tablet, Take 180 mg by mouth daily., Disp: , Rfl:    fluticasone (FLONASE) 50 MCG/ACT nasal spray, instill 1 spray into each nostril EACH NIGHT, Disp: , Rfl: 0   montelukast (SINGULAIR) 10 MG tablet, Take 1 tablet (10 mg total) by mouth at bedtime., Disp: 30 tablet, Rfl: 11   omeprazole (PRILOSEC) 40 MG capsule, Take  40 mg by mouth as needed., Disp: , Rfl: 0   predniSONE (STERAPRED UNI-PAK 21 TAB) 10 MG (21) TBPK tablet, Take as directed, Disp: 21 tablet, Rfl: 3   testosterone cypionate (DEPO-TESTOSTERONE) 200 MG/ML injection, Inject 0.5 mLs (100 mg total) into the muscle every 7 (seven) days., Disp: 10 mL, Rfl: 1   trimethoprim-polymyxin b (POLYTRIM) ophthalmic solution, Place 1 drop into the left eye every 6 (six) hours., Disp: 10 mL, Rfl: 0  Current Facility-Administered Medications:    testosterone cypionate (DEPOTESTOSTERONE CYPIONATE) injection 100 mg, 100 mg, Intramuscular, Once, Gosrani, Nimish C, MD      Objective:   Vitals:   12/10/22 1606  BP: 128/83  Pulse: 87  Temp: 98.2 F (36.8 C)  TempSrc: Oral  SpO2: 98%  Weight: 222 lb 9.6 oz (101 kg)  Height: 6' (1.829 m)    Estimated body mass index is 30.19 kg/m as calculated from the following:   Height as of this encounter: 6' (1.829 m).   Weight as of this encounter: 222 lb 9.6 oz (101 kg).  @WEIGHTCHANGE @  [employer]   12/10/22 1606  Weight: 222 lb 9.6 oz (101 kg)     Physical Exam   General: No distress. Looks well O2 at rest: no Cane present: no Sitting in wheel chair: no Frail: no Obese: no Neuro: Alert and Oriented x 3. GCS 15. Speech normal Psych: Pleasant Resp:  Barrel Chest - no.  Wheeze - no, Crackles - no, No overt respiratory distress CVS: Normal heart sounds. Murmurs - no Ext: Stigmata of Connective Tissue Disease - no HEENT: Normal upper airway. PEERL +. No post nasal drip        Assessment:       ICD-10-CM   1. Moderate persistent asthma without complication  J45.40          Plan:     Patient Instructions     ICD-10-CM   1. Moderate persistent asthma without complication  J45.40       No flare up Stable diseae   pLAN Continue on BREO 1 puff daily , rinse after use  Continue on Dupixent every 2 weeks .  Albuterol inhaler As needed   Ok to be STOP  Singulair Take   anti-histamine tablet Allegra/Zyrtec and monitor Continue  Flonase daily  Activity as tolerated.  Respect deferral flu shot  PLAN Follow up with Dr. Marchelle Gearing in 9 months and As needed     FOLLOWUP Return in about 9 months (around 09/09/2023) for 15 min visit, with Dr Marchelle Gearing, Face to Face Visit, Asthma.    SIGNATURE    Dr. Kalman English, M.D., F.C.C.P,  Pulmonary and Critical Care Medicine Staff Physician, [facility] Center Director - Interstitial Lung Disease  Program  Pulmonary Fibrosis [facility] at [facility] [place], [place], [address]  Pager: [phone], If no answer or between  15:00h - 7:00h: call [phone] Telephone: [phone]  4:45 PM 12/10/2022

## 2022-12-25 ENCOUNTER — Other Ambulatory Visit: Payer: Self-pay | Admitting: Internal Medicine

## 2022-12-25 DIAGNOSIS — D721 Eosinophilia, unspecified: Secondary | ICD-10-CM

## 2022-12-25 DIAGNOSIS — J4541 Moderate persistent asthma with (acute) exacerbation: Secondary | ICD-10-CM

## 2022-12-26 NOTE — Telephone Encounter (Signed)
Refill sent for [facility]ENT to [facility]: [phone]  Dose: 300mg  SQ every 14 days  Last OV: 12/10/22 Provider: Dr. Marchelle Gearing  Routing to scheduling team for follow-up on appt scheduling  Chesley Mires, PharmD, MPH, BCPS Clinical Pharmacist (Rheumatology and Pulmonology)

## 2023-01-07 IMAGING — DX DG CHEST 2V
2 series · 2 of 2 positions shown · non-contrast
Comparison: Coronary CT 01/10/2019.  Chest radiograph 08/17/2017

CLINICAL DATA: Persistent cough. Cough for 1 month when lying down.

EXAM:
CHEST - 2 VIEW

[dg chest 2 view (1 of 2)]
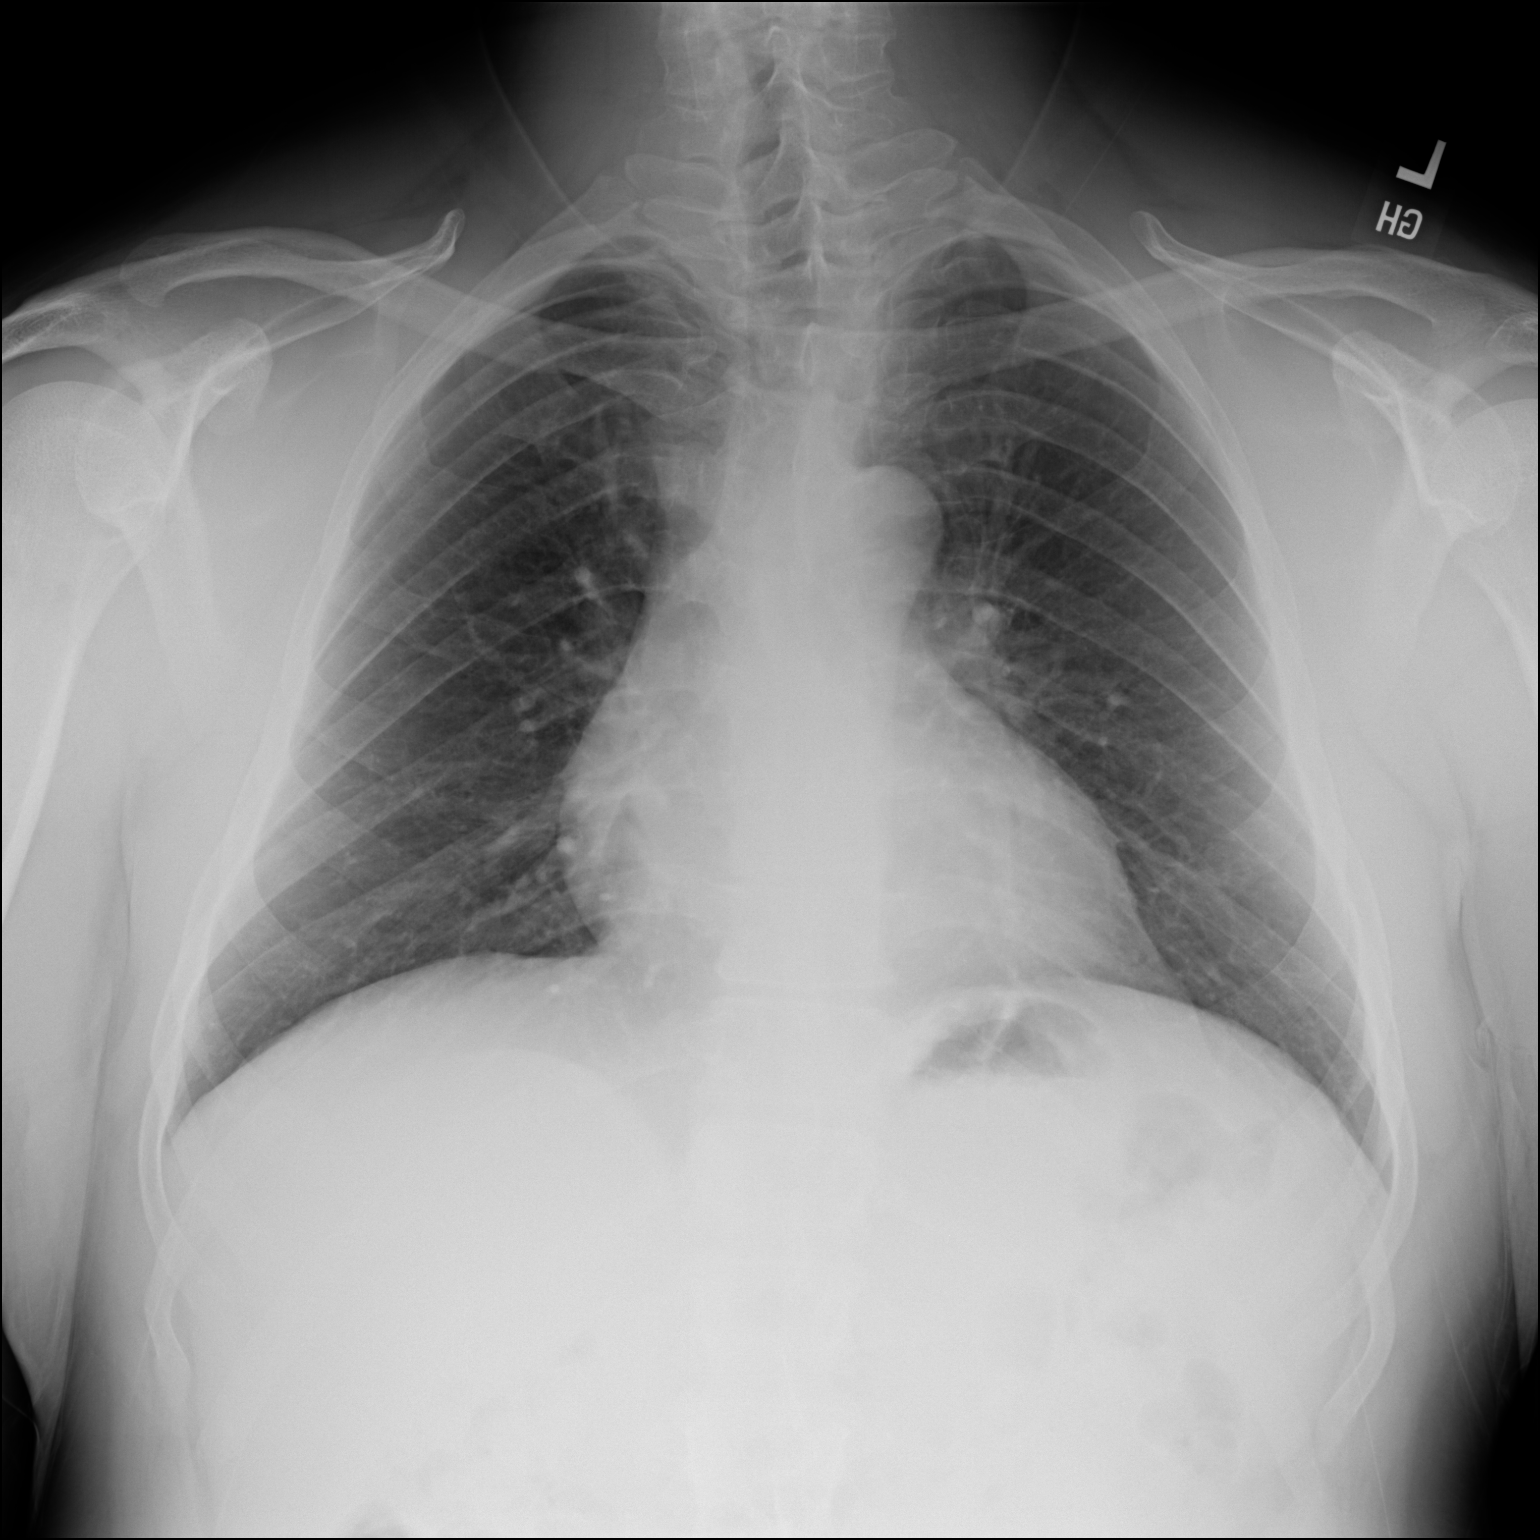

[dg chest 2 view (2 of 2)]
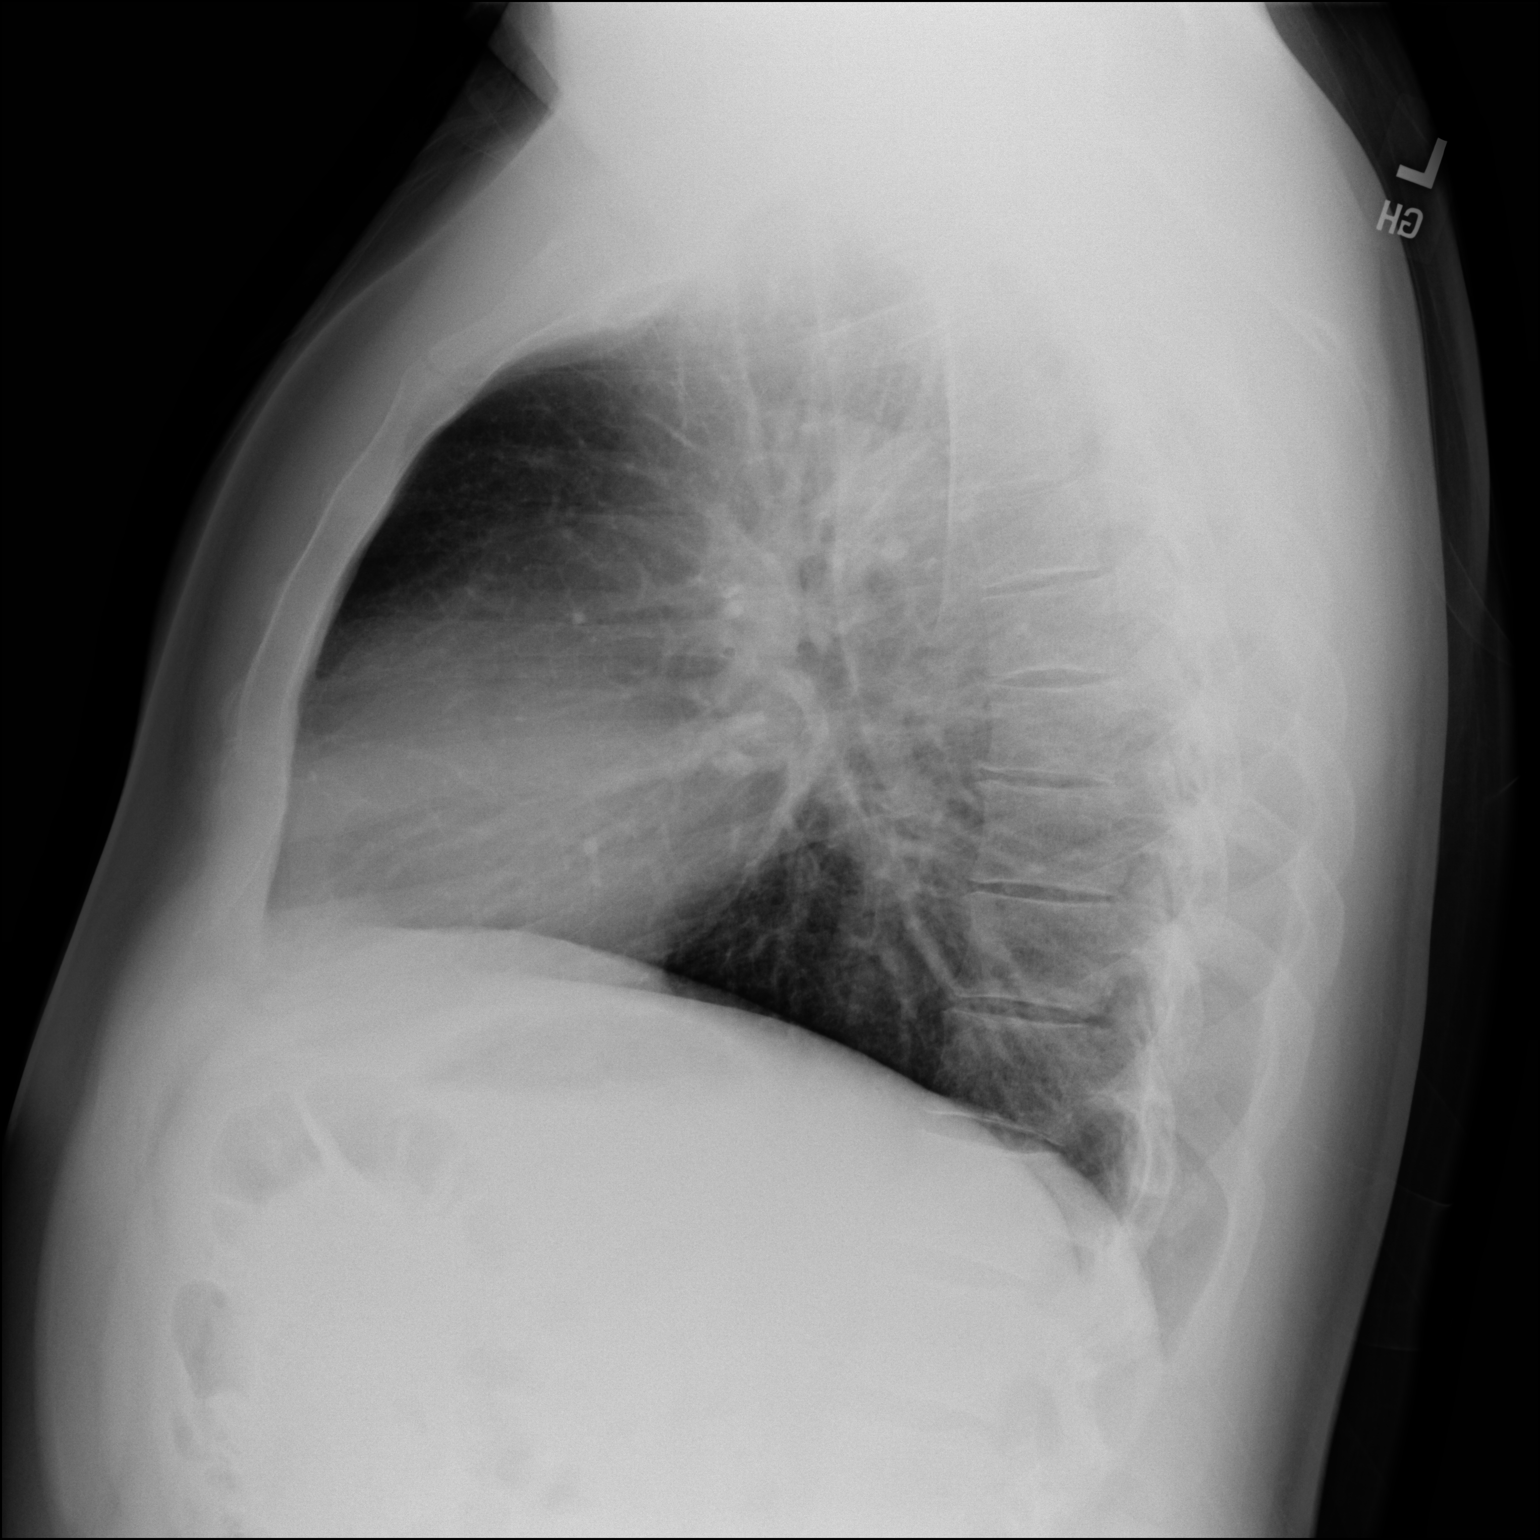

[2 of 2 positions shown; findings below may reference images not displayed]

FINDINGS: Normal heart size with stable mediastinal contours. No pulmonary
edema. No focal airspace disease. No pleural effusion or
pneumothorax. No osseous abnormalities are seen.
IMPRESSION: Unremarkable radiographs of the chest.  No explanation for cough.

## 2023-01-30 ENCOUNTER — Other Ambulatory Visit: Payer: Self-pay | Admitting: Internal Medicine

## 2023-06-09 ENCOUNTER — Other Ambulatory Visit: Payer: Self-pay | Admitting: Internal Medicine

## 2023-06-09 DIAGNOSIS — D721 Eosinophilia, unspecified: Secondary | ICD-10-CM

## 2023-06-09 DIAGNOSIS — J4541 Moderate persistent asthma with (acute) exacerbation: Secondary | ICD-10-CM

## 2023-06-09 NOTE — Telephone Encounter (Signed)
 Refill sent for DUPIXENT  to [facility]: [phone]  Dose: 300mg  subcut every 14 days  Last OV: 12/10/2022 Provider: Dr. Bertrum Brodie  Next OV: 08/31/23  Geraldene Kleine, PharmD, MPH, BCPS Clinical Pharmacist (Rheumatology and Pulmonology)

## 2023-06-24 ENCOUNTER — Ambulatory Visit: Admitting: Orthopaedic Surgery

## 2023-06-24 ENCOUNTER — Encounter: Payer: Self-pay | Admitting: Orthopaedic Surgery

## 2023-06-24 ENCOUNTER — Other Ambulatory Visit (INDEPENDENT_AMBULATORY_CARE_PROVIDER_SITE_OTHER): Payer: Self-pay

## 2023-06-24 DIAGNOSIS — G8929 Other chronic pain: Secondary | ICD-10-CM

## 2023-06-24 DIAGNOSIS — M545 Low back pain, unspecified: Secondary | ICD-10-CM | POA: Diagnosis not present

## 2023-06-24 MED ORDER — CYCLOBENZAPRINE HCL 5 MG PO TABS: 5.0000 mg | ORAL_TABLET | Freq: Three times a day (TID) | ORAL | 3 refills | Status: AC | PRN

## 2023-06-24 MED ORDER — PREDNISONE 10 MG (21) PO TBPK: ORAL_TABLET | ORAL | 3 refills | Status: DC

## 2023-06-24 NOTE — Progress Notes (Signed)
 Office Visit Note   Patient: Jeffrey English           Date of Birth: [birth year]           MRN: [id number] Visit Date: 06/24/2023              Requested by: No referring provider defined for this encounter. PCP: Jannelle Memory, MD (Inactive)   Assessment & Plan: Visit Diagnoses:  1. Chronic right-sided low back pain, unspecified whether sciatica present     Plan: History of Present Illness Jeffrey English is a 43 year old male who presents with low back pain radiating to the right leg.  He has experienced low back pain for two weeks, initiated by a movement while getting out of bed. The pain worsens with reaching and twisting, is located in the lower back, and radiates down the right leg. Sitting exacerbates the pain, while standing or walking provides relief. There is no numbness or tingling, but slight weakness in the right leg due to avoiding weight-bearing. Advil  offers some relief. He has recurring back issues, recently aggravated by playing tennis. He performs stretches to manage the condition.  Physical Exam MUSCULOSKELETAL: Left hip non-tender on rotation. Right hip non-tender on rotation. No tenderness in right trochanteric area. No tenderness along lumbar spine. Tenderness in right paraspinal muscles. Normal patellar tendon reflexes bilaterally. No muscular weakness. Positive sciatic tension sign on right leg.  Results RADIOLOGY Pelvis X-ray: Spine deviates to the left, likely positional due to muscular discomfort. No prior X-rays for comparison. (06/24/2023) Lumbar Spine X-ray: Lumbar spine is straight without the normal lordotic curvature. Intervertebral disc spaces are well preserved. No acute abnormalities. (06/24/2023)  Assessment and Plan Low back pain with sciatica Pain likely muscular, exacerbated by movement, with radiating pain and weakness in the right leg. X-rays show straight lumbar spine, no acute abnormalities. - Prescribe prednisone  dose pack for inflammation. -  Prescribe cyclobenzaprine (Flexeril) for muscle relaxation, nighttime dosing. - Order physical therapy for core strengthening and management of recurring back issues. - Advise ergonomic adjustments at work, such as using a standing desk.  Follow-Up Instructions: No follow-ups on file.   Orders:  Orders Placed This Encounter  Procedures   XR Lumbar Spine 2-3 Views   Ambulatory referral to Physical Therapy   Meds ordered this encounter  Medications   predniSONE  (STERAPRED UNI-PAK 21 TAB) 10 MG (21) TBPK tablet    Sig: Take as directed    Dispense:  21 tablet    Refill:  3   cyclobenzaprine (FLEXERIL) 5 MG tablet    Sig: Take 1-2 tablets (5-10 mg total) by mouth 3 (three) times daily as needed for muscle spasms.    Dispense:  30 tablet    Refill:  3    Subjective: Chief Complaint  Patient presents with   Lower Back - Pain    HPI  Review of Systems  Constitutional: Negative.   HENT: Negative.    Eyes: Negative.   Respiratory: Negative.    Cardiovascular: Negative.   Gastrointestinal: Negative.   Endocrine: Negative.   Genitourinary: Negative.   Skin: Negative.   Allergic/Immunologic: Negative.   Neurological: Negative.   Hematological: Negative.   Psychiatric/Behavioral: Negative.    All other systems reviewed and are negative.    Objective: Vital Signs: There were no vitals taken for this visit.  Physical Exam Vitals and nursing note reviewed.  Constitutional:      Appearance: He is well-developed.  Pulmonary:     Effort: Pulmonary  effort is normal.  Abdominal:     Palpations: Abdomen is soft.  Skin:    General: Skin is warm.  Neurological:     Mental Status: He is alert and oriented to person, place, and time.  Psychiatric:        Behavior: Behavior normal.        Thought Content: Thought content normal.        Judgment: Judgment normal.      Imaging: XR Lumbar Spine 2-3 Views Result Date: 06/24/2023 X-rays of the lumbar spine show no acute or  structural abnormalities.  Loss of lumbar lordosis could be congenital versus positional.  Intervertebral disc spaces are well-preserved.    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   Rectal bleeding 06/13/2013   KNEE PAIN, RIGHT, ACUTE 11/26/2009   OTHER ACQUIRED DEFORMITY OF ANKLE AND FOOT OTHER 05/23/2009   Past Medical History:  Diagnosis Date   ADHD    Anxiety    Hypertension     Family History  Problem Relation Age of Onset   Cancer Paternal Uncle 68       lung cancer    Past Surgical History:  Procedure Laterality Date   HEMORROIDECTOMY  2014   INGUINAL HERNIA REPAIR Left 06/27/2019   Procedure: LAPAROSCOPIC LEFT INGUINAL HERNIA REPAIR WITH MESH;  Surgeon: Oza Blumenthal, MD;  Location: [facility];  Service: General;  Laterality: Left;   NASAL SEPTOPLASTY W/ TURBINOPLASTY Bilateral 01/16/2020   Procedure: NASAL SEPTOPLASTY WITH TURBINATE REDUCTION;  Surgeon: Ammon Bales, MD;  Location: [facility];  Service: ENT;  Laterality: Bilateral;   Social History   Occupational History   Not on file  Tobacco Use   Smoking status: Former    Current packs/day: 0.00    Types: Cigarettes    Quit date: 06/13/2008    Years since quitting: 15.0   Smokeless tobacco: Never  Vaping Use   Vaping status: Never Used  Substance and Sexual Activity   Alcohol use: Yes    Alcohol/week: 7.0 standard drinks of alcohol    Types: 7 Shots of liquor per week   Drug use: No   Sexual activity: Not on file

## 2023-06-25 ENCOUNTER — Telehealth: Payer: Self-pay

## 2023-06-25 NOTE — Telephone Encounter (Signed)
 Received notification from [facility] regarding a prior authorization for DUPIXENT . Authorization has been APPROVED from 06/25/2023 to 06/24/2024. Approval letter sent to scan center.  Patient must continue to fill through [facility]: [phone]  Authorization # [id number]  Geraldene Kleine, PharmD, MPH, BCPS, CPP Clinical Pharmacist (Rheumatology and Pulmonology)

## 2023-06-25 NOTE — Telephone Encounter (Signed)
 Submitted a Prior Authorization RENEWAL request to [facility] for DUPIXENT  via CoverMyMeds. Will update once we receive a response.  Key: [id number]

## 2023-08-31 ENCOUNTER — Ambulatory Visit: Payer: Self-pay | Admitting: Internal Medicine

## 2023-09-03 ENCOUNTER — Other Ambulatory Visit: Payer: Self-pay | Admitting: Internal Medicine

## 2023-09-03 DIAGNOSIS — J4541 Moderate persistent asthma with (acute) exacerbation: Secondary | ICD-10-CM

## 2023-09-03 DIAGNOSIS — D721 Eosinophilia, unspecified: Secondary | ICD-10-CM

## 2023-09-04 NOTE — Telephone Encounter (Signed)
 Refill sent for DUPIXENT  to [facility]: [phone]  Dose: 300mg  subcut every 14 days  Last OV: 12/10/2022 Provider: Dr. Geronimo  Next OV: 11/20/2023  Sherry Pennant, PharmD, MPH, BCPS Clinical Pharmacist (Rheumatology and Pulmonology)

## 2023-11-04 NOTE — Progress Notes (Unsigned)
 OV 08/09/2021  Subjective:  Patient ID: Jeffrey English, male , DOB: 19-Oct-1980 , age 43 y.o. , MRN: [id number] , ADDRESS: [address] [place] [facility] [id number] PCP Hugh Charleston, MD Patient Care Team: Hugh Charleston, MD as PCP - General (Family Medicine)  This Provider for this visit: Treatment Team:  Attending Provider: Kassie Acquanetta Bradley, MD    08/09/2021 -   Chief Complaint  Patient presents with   Consult    Pt states that he has been having complaints of wheezing as well as SOB which began 2 months ago. States that he did have asthma a a child. Pt also has had a lingering cough off and on for the past 2 years.     HPI Jeffrey English 43 y.o. 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  He tells me that he had asthma as a child and then outgrew.  In the past he is also had deviated nasal septum and ENT surgery for which a lot of mucus was cleaned out by Dr. Mable.  At that time antibiotic help.  Has been on Flonase.  He said he had COVID 2 years ago and then after that for the last 2 years he has had intermittent low-grade chronic cough.  He said people joked that he has long COVID.  Then in April 2023 sudden degeneration into chronic cough with congestion and wheezing.  Symptoms are present at night.  I believe he took some treatment and then he started feeling better.  Then in May 2023 he went to [place] with his wife and that the cough got worse.  He started using his albuterol more.  This seemed to help.  His symptoms particularly worse at night.  Then he came back to the [place]  after his vacation.  Around Jun 29, 2021 he went to urgent care.  He was found to be wheezing he was given 5-day prednisone  also given doxycycline.  He says this helped but not fully.  However he has been able to play tennis.  Then this past week he feels his symptoms have come back particularly similar to the episode in April.  His a lot of cough is wheezing.  Symptoms are present at  night.  Yet in the daytime is able to play tennis.  This particular week has been bad with a lot of congestion throat pain cough wheezing.  Most symptoms are at night.  Albuterol is helping but only somewhat.  No fever    CT Chest data November 2020:-coronary calcium CT November 2020: Calcium score 0.  No pulmonary lung abnormalities.  [Personally visualized]  Chest x-ray: August 2022 done for cough was clear.  [Personally visualized the image]  Noted to be on testosterone  for the last 1 year.  Monitors his PSA.     09/02/21 Follow up : Asthma  Patient returns for a 1 month follow-up.  Last visit presented for ongoing cough wheezing and shortness of breath.  Patient says he had increased symptoms since having COVID-19 in 2021.  He has had ongoing daily cough.  Last visit he was treated with a prednisone  taper.  Started on Breo 200 once daily.  Exhaled nitric oxide  testing was elevated at 90ppb.  Labs showed an elevated IgE, strongly positive dust mite allergens.  And mildly positive cat and dog dander.  Absolute eosinophil count was 300 D-dimer was negative.  Chest x-ray showed no acute process.  Some mild peribronchial thickening. Since last visit patient  is feeling much better.  Cough and wheezing are substantially improved.  Patient continues to have some lingering dry cough.  Patient is on ACE inhibitor.  Patient also has chronic allergies.  Is on Allegra and Flonase.  PFTs were ordered but have not been scheduled yet.  He denies any fever, discolored mucus chest pain orthopnea PND or increased leg swelling         OV 11/11/2021  Subjective:  Patient ID: Jeffrey English, male , DOB: 06/11/1980 , age 47 y.o. , MRN: [id number] , ADDRESS: [address] [place] [facility] [id number] PCP Hugh Charleston, MD Patient Care Team: Hugh Charleston, MD as PCP - General (Family Medicine)  This Provider for this visit: Treatment Team:  Attending Provider: Geronimo Amel, MD    11/11/2021 -    Chief Complaint  Patient presents with   Follow-up    Pt states he started wheezing at night a couple weeks ago and also had congestion in chest.     HPI Jeffrey English 43 y.o. -returns for follow-up.  He says after he saw me he started the Breo took a short course of oral prednisone  and was doing really well.  His results show that he does have elevated IgE and also elevated eosinophils and strongly positive RAST allergy  panel to house dust mite.  However he says in the last few weeks he is starting to get symptomatic again.  He does start having wheezing intermittently although he is not waking up from it.  No chest tightness.  No cough.  He is still able to play tennis quite well.  He did have cough but that resolved after he stopped his lisinopril.  He feels he is sleeping back although is not as worse as he was 1 year ago.  He did pulmonary function test on November 04, 2021 and this shows resstriction with high DLCO.  His spirometry is not classic for asthma but he did have +8% bronchodilator response.  His DLCO is elevated and his consistent with asthma findings.        12/23/2021 Follow up : Asthma  Patient returns for a 6-week follow-up.  Patient was seen last visit with asthma exacerbation despite Breo daily.  He was started on Singulair  daily continued on Zyrtec and Flonase.  Dupixent  was started.  Patient has an allergic phenotype with previously elevated exhaled nitric oxide  testing, elevated IgE and positive allergy  profile and elevated eosinophils. Patient says he has had 3 doses of Dupixent .  Has tolerated well.  Patient says overall feels that his asthma is under better control.  He is has resolution of cough and wheezing.  He does feel that his cough resolved after stopping his ACE inhibitor.  Patient has decreased his albuterol use to once a week.  Remains active plays tennis on a regular basis.  Exhaled nitric on test today in the office shows Exhaled nitric oxide  testing today  improved at 20ppb  TEST/EVENTS :  CT Chest data November 2020:-coronary calcium CT November 2020: Calcium score 0.  No pulmonary lung abnormalities.     Chest x-ray: August 2022 done for cough was clear.     Exhaled nitric oxide  testing was elevated at 90ppb.  Allergy  panel -elevated IgE, strongly positive dust allergens.  And mildly positive cat and dog dander.  Absolute eosinophil count was 300   PFTs November 04, 2021 showed FEV1 at 74%, ratio 80, FVC 73%, no significant bronchodilator response (8% change), DLCO 120%.  OV 04/03/2022  Subjective:  Patient  ID: Jeffrey English, male , DOB: [birth year] , age 19 y.o. , MRN: [id number] , ADDRESS: [address] [place] [facility] [address] PCP Hugh Charleston, MD Patient Care Team: Hugh Charleston, MD as PCP - General (Family Medicine)  This Provider for this visit: Treatment Team:  Attending Provider: Geronimo Amel, MD    04/03/2022 -   Chief Complaint  Patient presents with   Follow-up    No c/o      HPI Jeffrey English 43 y.o. -returns for follow-up.  After I saw him he did see nurse practitioner.  He is currently on Dupixent  every 2 weeks.  Occasionally he forgets the exact schedule but then gets it done within a window few days.  He continues on Breo.  He continues on albuterol as needed but he rarely ever needs it.  He is now off Singulair .  He does take his antihistamine on a regular basis.  And his Flonase on as-needed basis.  His asthma is well-controlled.  He has played tennis and he feels his effort tolerance and tenderness is better.  No nocturnal awakenings known albuterol rescue use.  No exacerbations.   OV 12/10/2022  Subjective:  Patient ID: Jeffrey English, male , DOB: 11-Oct-1980 , age 56 y.o. , MRN: [id number] , ADDRESS: [address] [place] [facility] [id number] PCP Hugh Charleston, MD (Inactive) Patient Care Team: Hugh Charleston, MD (Inactive) as PCP - General (Family Medicine)  This Provider for this visit: Treatment  Team:  Attending Provider: Geronimo Amel, MD    12/10/2022 -   Chief Complaint  Patient presents with   Follow-up    HPI Jeffrey English 43 y.o. -returns for his routine asthma follow-up.  He continues to play tennis and be very active.  His albuterol use is very rare it happened once after tennis.  He otherwise is doing well.  He is taking Dupixent .  He is on Allegra he is on Breo he is on Singulair .  No hospitalizations no ER visits no urgent care visits no surgeries.  Exhaled nitric oxide  is normal today and this is a big improvement.  He is not interested in the flu shot today.  Currently no wheezing.  No nocturnal awakenings.    OV 11/05/2023  Subjective:  Patient ID: Jeffrey English, male , DOB: [birth year] , age 85 y.o. 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       HPI Jeffrey English 43 y.o. -presents for followup. Last visit late last year stopped singulair . He has continued Dupixent  and Breo.  He is also lost weight by taking Mounjaro.  He is playing tennis.  However he says for the last month or so maybe even 3 to 4 months is having cough that is productive especially in the morning start early in the morning and last all morning is clear mucus  he treats it with Zyrtec.  There is an occasional wheeze [he did have left lower lobe  superior segment wheezing today].  There is no edema paroxysmal nocturnal dyspnea.  He feels it is because the Singulair  was stopped but otherwise he is doing well  Symptom score is stable at ACT 21 Exam nitric oxide  test is also normal at 22 ppb    Asthma Control Test ACT Total Score  11/05/2023 10:21 AM 21  04/03/2022  8:31 AM 23  12/23/2021  3:08 PM 21     Lab Results  Component Value Date   NITRICOXIDE 22 11/05/2023     2  PFT     Latest Ref Rng & Units 11/04/2021   11:48 AM  PFT Results  FVC-Pre L 3.84   FVC-Predicted Pre % 68   FVC-Post L 4.09   FVC-Predicted Post % 73   Pre FEV1/FVC % % 79   Post FEV1/FCV % % 80   FEV1-Pre L 3.03   FEV1-Predicted Pre % 68   FEV1-Post L 3.28   DLCO uncorrected ml/min/mmHg 39.23   DLCO UNC% % 120   DLCO corrected ml/min/mmHg 39.23   DLCO COR %Predicted % 120   DLVA Predicted % 142   TLC L 5.97   TLC % Predicted % 81   RV % Predicted % 85        LAB RESULTS last 96 hours No results found.       has a past medical history of ADHD, Anxiety, and Hypertension.   reports that he quit smoking about 15 years ago. His smoking use included cigarettes. He has never used smokeless tobacco.  Past Surgical History:  Procedure Laterality Date   HEMORROIDECTOMY  2014   INGUINAL HERNIA REPAIR Left 06/27/2019   Procedure: LAPAROSCOPIC LEFT INGUINAL HERNIA REPAIR WITH MESH;  Surgeon: Vernetta Berg, MD;  Location: [facility];  Service: General;  Laterality: Left;   NASAL SEPTOPLASTY W/ TURBINOPLASTY Bilateral 01/16/2020   Procedure: NASAL SEPTOPLASTY WITH TURBINATE REDUCTION;  Surgeon: Mable Lenis, MD;  Location: [facility];  Service: ENT;  Laterality: Bilateral;    Allergies  Allergen Reactions   Clonazepam Other (See Comments)   Lorazepam Other (See Comments)     There is no immunization history on file for this patient.  Family History  Problem Relation Age of Onset   Cancer  Paternal Uncle 80       lung cancer     Current Outpatient Medications:    albuterol (VENTOLIN HFA) 108 (90 Base) MCG/ACT inhaler, SMARTSIG:By Mouth, Disp: , Rfl:    ALPRAZolam (XANAX) 0.5 MG tablet, Take 0.5 mg by mouth daily as needed for anxiety., Disp: , Rfl:    amLODipine (NORVASC) 5 MG tablet, Take 5 mg by mouth daily., Disp: , Rfl:    amphetamine-dextroamphetamine (ADDERALL XR) 20 MG 24 hr capsule, Take 20 mg by mouth every morning., Disp: , Rfl:    amphetamine-dextroamphetamine (ADDERALL) 20 MG tablet, Take 20 mg by mouth daily., Disp: , Rfl:    Cholecalciferol (VITAMIN D3) 75 MCG (3000 UT) TABS, Take 3,000 Units by mouth daily., Disp: , Rfl:    colchicine  0.6 MG tablet, Take 1 tablet (0.6 mg total) by mouth 2 (two) times daily as needed., Disp: 20 tablet, Rfl: 0   cyclobenzaprine  (FLEXERIL ) 5 MG tablet, Take 1-2 tablets (5-10 mg total) by mouth 3 (three) times daily as needed for muscle spasms., Disp: 30 tablet, Rfl: 3   DUPIXENT  300 MG/2ML SOAJ,  INJECT 1 PEN UNDER THE SKIN EVERY 14 DAYS, Disp: 12 mL, Rfl: 0   fluticasone  furoate-vilanterol (BREO ELLIPTA ) 200-25 MCG/ACT AEPB, TAKE 1 PUFF BY MOUTH EVERY DAY, Disp: 60 each, Rfl: 8   montelukast  (SINGULAIR ) 10 MG tablet, Take 1 tablet (10 mg total) by mouth at bedtime., Disp: 30 tablet, Rfl: 11   omeprazole (PRILOSEC) 40 MG capsule, Take 40 mg by mouth as needed., Disp: , Rfl: 0   testosterone  cypionate (DEPO-TESTOSTERONE ) 200 MG/ML injection, Inject 0.5 mLs (100 mg total) into the muscle every 7 (seven) days., Disp: 10 mL, Rfl: 1   tirzepatide (MOUNJARO) 10 MG/0.5ML Pen, Inject 10 mg into the skin once a week., Disp: , Rfl:    predniSONE  (STERAPRED UNI-PAK 21 TAB) 10 MG (21) TBPK tablet, Take as directed, Disp: 21 tablet, Rfl: 3  Current Facility-Administered Medications:    testosterone  cypionate (DEPOTESTOSTERONE CYPIONATE) injection 100 mg, 100 mg, Intramuscular, Once, Gosrani, Nimish C, MD      Objective:   Vitals:   11/05/23  1031  BP: 110/82  Pulse: 70  SpO2: 100%  Weight: 197 lb (89.4 kg)  Height: 6' (1.829 m)    Estimated body mass index is 26.72 kg/m as calculated from the following:   Height as of this encounter: 6' (1.829 m).   Weight as of this encounter: 197 lb (89.4 kg).  @WEIGHTCHANGE @  [employer]   11/05/23 1031  Weight: 197 lb (89.4 kg)     Physical Exam   General: No distress. Looks well O2 at rest: no Cane present: no Sitting in wheel chair: no Frail: no Obese: no Neuro: Alert and Oriented x 3. GCS 15. Speech normal Psych: Pleasant Resp:  Barrel Chest - no.  Wheeze - YES jut LLL superior segment, Crackles - no, No overt respiratory distress CVS: Normal heart sounds. Murmurs - no Ext: Stigmata of Connective Tissue Disease - no HEENT: Normal upper airway. PEERL +. No post nasal drip        Assessment/     Assessment & Plan Moderate persistent asthma with acute exacerbation  Chronic cough    PLAN Patient Instructions     ICD-10-CM   1. Moderate persistent asthma with acute exacerbation  J45.41 Nitric oxide     2. Chronic cough  R05.3       Seems there is increase/return of cough for no reason other than maybe change I weather or lack of singulair    Plan CXR 11/05/2023   Shared decision to avoid steroids Restart singulair  10mg  QHS Continue on BREO 1 puff daily , rinse after use  Continue on Dupixent  every 2 weeks .  Albuterol inhaler As needed   Take  anti-histamine  tablet Allegra/Zyrtec and monitor Continue  Flonase daily  Activity as tolerated.    PLAN 8 weeks video visit with APP    FOLLOWUP    Return for 8 weeks video visit with APP.    SIGNATURE    Dr. Dorethia Cave, M.D., F.C.C.P,  Pulmonary and Critical Care Medicine Staff Physician, [facility] Director - Interstitial Lung Disease  Program  Pulmonary Fibrosis [facility] at [facility] [place], [place], [address]  Pager: [phone],  If no answer or between  15:00h - 7:00h: call [phone] Telephone: [phone]  12:25 PM 11/05/2023

## 2023-11-04 NOTE — Patient Instructions (Signed)
 ICD-10-CM   1. Moderate persistent asthma without complication  J45.40       No flare up Stable diseae   pLAN Continue on BREO 1 puff daily , rinse after use  Continue on Dupixent every 2 weeks .  Albuterol inhaler As needed   Ok to be STOP  Singulair Take  anti-histamine tablet Allegra/Zyrtec and monitor Continue  Flonase daily  Activity as tolerated.  Respect deferral flu shot  PLAN Follow up with Dr. Marchelle Gearing in 9 months and As needed

## 2023-11-05 ENCOUNTER — Ambulatory Visit

## 2023-11-05 ENCOUNTER — Ambulatory Visit: Payer: Self-pay | Admitting: Internal Medicine

## 2023-11-05 ENCOUNTER — Encounter: Payer: Self-pay | Admitting: Internal Medicine

## 2023-11-05 VITALS — BP 110/82 | HR 70 | Ht 72.0 in | Wt 197.0 lb

## 2023-11-05 DIAGNOSIS — J4541 Moderate persistent asthma with (acute) exacerbation: Secondary | ICD-10-CM

## 2023-11-05 DIAGNOSIS — R053 Chronic cough: Secondary | ICD-10-CM | POA: Diagnosis not present

## 2023-11-05 LAB — NITRIC OXIDE: Nitric Oxide: 22

## 2023-11-05 MED ORDER — MONTELUKAST SODIUM 10 MG PO TABS: 10.0000 mg | ORAL_TABLET | Freq: Every day | ORAL | 11 refills | Status: AC

## 2023-11-09 ENCOUNTER — Encounter: Payer: Self-pay | Admitting: Internal Medicine

## 2023-11-15 ENCOUNTER — Ambulatory Visit: Payer: Self-pay | Admitting: Internal Medicine

## 2023-11-15 NOTE — Progress Notes (Signed)
 Normal chest x-ray.

## 2023-11-16 ENCOUNTER — Other Ambulatory Visit: Payer: Self-pay | Admitting: Internal Medicine

## 2023-11-20 ENCOUNTER — Ambulatory Visit: Payer: Self-pay | Admitting: Internal Medicine

## 2023-12-07 ENCOUNTER — Other Ambulatory Visit: Payer: Self-pay | Admitting: Internal Medicine

## 2023-12-07 DIAGNOSIS — J4541 Moderate persistent asthma with (acute) exacerbation: Secondary | ICD-10-CM

## 2023-12-07 DIAGNOSIS — D721 Eosinophilia, unspecified: Secondary | ICD-10-CM

## 2023-12-08 NOTE — Telephone Encounter (Signed)
 Refill sent for DUPIXENT  to [facility]: [phone]  Dose: 300mg  [place] every 14 days   Last OV: 11/05/23 Provider: Dr. Geronimo  Next OV: 12/31/23  Aleck Puls, PharmD, BCPS Clinical Pharmacist  [facility]

## 2023-12-14 ENCOUNTER — Encounter: Payer: Self-pay | Admitting: Radiology

## 2023-12-31 ENCOUNTER — Telehealth: Admitting: Adult Health

## 2023-12-31 ENCOUNTER — Encounter: Payer: Self-pay | Admitting: Adult Health

## 2023-12-31 VITALS — Ht 72.0 in | Wt 187.0 lb

## 2023-12-31 DIAGNOSIS — J309 Allergic rhinitis, unspecified: Secondary | ICD-10-CM

## 2023-12-31 DIAGNOSIS — R053 Chronic cough: Secondary | ICD-10-CM

## 2023-12-31 DIAGNOSIS — R059 Cough, unspecified: Secondary | ICD-10-CM | POA: Diagnosis not present

## 2023-12-31 DIAGNOSIS — K219 Gastro-esophageal reflux disease without esophagitis: Secondary | ICD-10-CM

## 2023-12-31 DIAGNOSIS — J4541 Moderate persistent asthma with (acute) exacerbation: Secondary | ICD-10-CM

## 2023-12-31 MED ORDER — BENZONATATE 200 MG PO CAPS: 200.0000 mg | ORAL_CAPSULE | Freq: Three times a day (TID) | ORAL | 1 refills | Status: AC | PRN

## 2023-12-31 MED ORDER — OMEPRAZOLE 40 MG PO CPDR: 40.0000 mg | DELAYED_RELEASE_CAPSULE | Freq: Every day | ORAL | 5 refills | Status: AC | PRN

## 2023-12-31 MED ORDER — PREDNISONE 10 MG PO TABS: ORAL_TABLET | ORAL | 0 refills | Status: AC

## 2023-12-31 NOTE — Patient Instructions (Addendum)
 Continue on BREO 1 puff daily , rinse after use  Continue on Dupixent  every 2 weeks .  Albuterol inhaler As needed   Continue on Zyrtec, Singulair ,  and Flonase daily  Restart Omeprazole  40 mg daily  Prednisone  taper over next week.  Begin Delsym 2 tsp Twice daily, As needed  cough  Begin Tessalon  Three times a day  for cough As needed   Activity as tolerated. Follow up with Dr. Geronimo in 3-4 months and As needed  with PFT  Please contact office for sooner follow up if symptoms do not improve or worsen or seek emergency care

## 2023-12-31 NOTE — Progress Notes (Signed)
 Virtual Visit via Video Note  I connected with Jeffrey English on 12/31/23 at  3:00 PM EST by a video enabled telemedicine application and verified that I am speaking with the correct person using two identifiers.  Location: Patient: Home  Provider: Office    I discussed the limitations of evaluation and management by telemedicine and the availability of in person appointments. The patient expressed understanding and agreed to proceed.  History of Present Illness: 43 yo male followed for Asthma with allergic phenotype and chronic cough  Hx of Covid 19 infection 2021 with post viral cough and history of Ace inhibitor related cough   Discussed the use of AI scribe software for clinical note transcription with the patient, who gave verbal consent to proceed.  History of Present Illness Jeffrey English is a 43 year old male with asthma who presents with a persistent cough.  He has stable asthma symptoms since his last visit in September, maintained with Breo inhaler once daily and Dupixent  every two weeks. He missed one dose of Dupixent  in the last eight doses but caught up in the third week. He has experienced ongoing insurance issues regarding Dupixent  delivery, requiring communication between his insurance, provider, and the medication company.  He describes a persistent, productive cough, particularly noticeable in the morning. The cough is not debilitating but is described as annoying. He has been taking Singulair  consistently since the last visit, which has led to slight improvement but not resolution of the cough. The cough differs from a previous dry cough experienced after a COVID infection, which was related to lisinopril use. He has not been using specific cough medicine but takes Prilosec for reflux, which he ran out of a week ago. He requests a prescription refill for Prilosec. The cough occurs about five to six times a day, producing phlegm, and is more of an annoyance due to its presence  rather than frequency.  He recalls previous use of prednisone  for respiratory issues and back pain, noting that a consistent dose is easier to manage. He has not taken prednisone  for several months, with the last use being for a back issue in the summer. He confirms that his cough improved when he was on prednisone .  He plays tennis three times a week without experiencing labored breathing, indicating good asthma control aside from the cough. He has a history of allergies, with high IgE levels and significant dust allergies, as well as mild allergies to cat and dog dander. He has implemented several home modifications to reduce allergen exposure, such as using hypoallergenic bedding and changing pillowcases frequently.   Observations/Objective: CT Chest data November 2020:-coronary calcium CT November 2020: Calcium score 0.  No pulmonary lung abnormalities.     Chest x-ray: August 2022 done for cough was clear.      2023 Allergy  panel -elevated IgE (546) , strongly positive dust allergens.  And mildly positive cat and dog dander.  Absolute eosinophil count was 300    PFTs November 04, 2021 showed FEV1 at 74%, ratio 80, FVC 73%, no significant bronchodilator response (8% change), DLCO 120%.  Chest xray 11/05/23 clear lungs.   Assessment and Plan: Assessment and Plan Assessment & Plan Moderate persistent asthma with productive cough  -exacerbation  Begin prednisone  taper.  Continue on Breo.  Along with Dupixent  every 2 weeks.  Continue with trigger prevention.  Restart omeprazole  for possible GERD component.  Control for cough with Delsym and Tessalon  [place].   Encourage sips of water to soothe the throat.  Schedule a pulmonary function test before the next follow-up visit.  Allergic rhinitis due to dust mites and animal dander   Allergic rhinitis likely contributes to the cough. Previous allergy  testing showed high IgE levels and strong allergies to dust mites and animal dander. Current  management with Singulair  and Dupixent  Environmental modifications have been implemented, but further intervention may be needed if symptoms persist. Continue Singulair  and Dupixent . Consider allergy  referral if symptoms persist after cough resolution. Encourage environmental modifications such as stronger allergy  filters, allergy  pillow cases, and air purifiers.  Gastroesophageal reflux disease (GERD)   GERD is managed with Prilosec, which was recently discontinued. GERD may contribute to the cough and needs management to prevent symptom exacerbation. Prescribe Prilosec (omeprazole ) daily for GERD management.  Plan  Patient Instructions  Continue on BREO 1 puff daily , rinse after use  Continue on Dupixent  every 2 weeks .  Albuterol inhaler As needed   Continue on Zyrtec, Singulair ,  and Flonase daily  Restart Omeprazole  40 mg daily  Prednisone  taper over next week.  Begin Delsym 2 tsp Twice daily, As needed  cough  Begin Tessalon  Three times a day  for cough As needed   Activity as tolerated. Follow up with Dr. Geronimo in 3-4 months and As needed  with PFT  Please contact office for sooner follow up if symptoms do not improve or worsen or seek emergency care         Follow Up Instructions:    I discussed the assessment and treatment plan with the patient. The patient was provided an opportunity to ask questions and all were answered. The patient agreed with the plan and demonstrated an understanding of the instructions.   The patient was advised to call back or seek an in-person evaluation if the symptoms worsen or if the condition fails to improve as anticipated.  I provided 30  minutes of non-face-to-face time during this encounter.   Madelin Stank, NP

## 2024-01-11 ENCOUNTER — Encounter: Payer: Self-pay | Admitting: Internal Medicine
